# Patient Record
Sex: Female | Born: 1985 | Race: White | Hispanic: No | Marital: Married | Smoking: Never smoker
Health system: Southern US, Community
[De-identification: ages and names within clinical notes are randomized; demographics above are authoritative.]

## PROBLEM LIST (undated history)

## (undated) DIAGNOSIS — O139 Gestational [pregnancy-induced] hypertension without significant proteinuria, unspecified trimester: Secondary | ICD-10-CM

## (undated) DIAGNOSIS — I10 Essential (primary) hypertension: Secondary | ICD-10-CM

## (undated) DIAGNOSIS — F329 Major depressive disorder, single episode, unspecified: Secondary | ICD-10-CM

## (undated) DIAGNOSIS — F419 Anxiety disorder, unspecified: Secondary | ICD-10-CM

## (undated) DIAGNOSIS — F32A Depression, unspecified: Secondary | ICD-10-CM

## (undated) HISTORY — PX: WISDOM TOOTH EXTRACTION: SHX21

## (undated) HISTORY — PX: HERNIA REPAIR: SHX51

## (undated) HISTORY — PX: FOOT SURGERY: SHX648

## (undated) HISTORY — PX: NASAL SINUS SURGERY: SHX719

## (undated) HISTORY — DX: Gestational (pregnancy-induced) hypertension without significant proteinuria, unspecified trimester: O13.9

---
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
## 2004-08-22 ENCOUNTER — Other Ambulatory Visit: Admission: RE | Admit: 2004-08-22 | Discharge: 2004-08-22 | Payer: Self-pay | Admitting: *Deleted

## 2005-09-04 ENCOUNTER — Other Ambulatory Visit: Admission: RE | Admit: 2005-09-04 | Discharge: 2005-09-04 | Payer: Self-pay | Admitting: *Deleted

## 2006-09-05 ENCOUNTER — Other Ambulatory Visit: Admission: RE | Admit: 2006-09-05 | Discharge: 2006-09-05 | Payer: Self-pay | Admitting: *Deleted

## 2007-10-02 ENCOUNTER — Other Ambulatory Visit: Admission: RE | Admit: 2007-10-02 | Discharge: 2007-10-02 | Payer: Self-pay | Admitting: Family Medicine

## 2008-10-28 ENCOUNTER — Other Ambulatory Visit: Admission: RE | Admit: 2008-10-28 | Discharge: 2008-10-28 | Payer: Self-pay | Admitting: Family Medicine

## 2009-09-12 ENCOUNTER — Emergency Department (HOSPITAL_BASED_OUTPATIENT_CLINIC_OR_DEPARTMENT_OTHER): Admission: EM | Admit: 2009-09-12 | Discharge: 2009-09-12 | Payer: Self-pay | Admitting: Emergency Medicine

## 2009-09-12 ENCOUNTER — Ambulatory Visit: Payer: Self-pay | Admitting: Diagnostic Radiology

## 2009-12-27 ENCOUNTER — Encounter: Admission: RE | Admit: 2009-12-27 | Discharge: 2009-12-27 | Payer: Self-pay | Admitting: Family Medicine

## 2010-01-18 ENCOUNTER — Other Ambulatory Visit: Admission: RE | Admit: 2010-01-18 | Discharge: 2010-01-18 | Payer: Self-pay | Admitting: Family Medicine

## 2011-02-10 IMAGING — US US SOFT TISSUE HEAD/NECK
1 series · 14 of 25 positions shown · non-contrast
Comparison: None.

CLINICAL DATA: Thyromegaly

THYROID ULTRASOUND
TECHNIQUE: Ultrasound examination of the thyroid gland and
adjacent soft tissues was performed.

[Series 1: us soft tissue head/neck · 0.06mm/px · 14 of 25 slices shown]
[im 1/25]
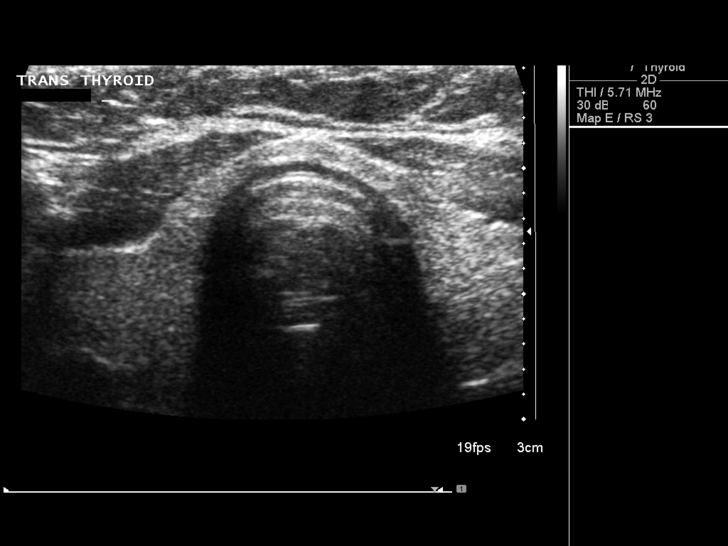
[im 3/25]
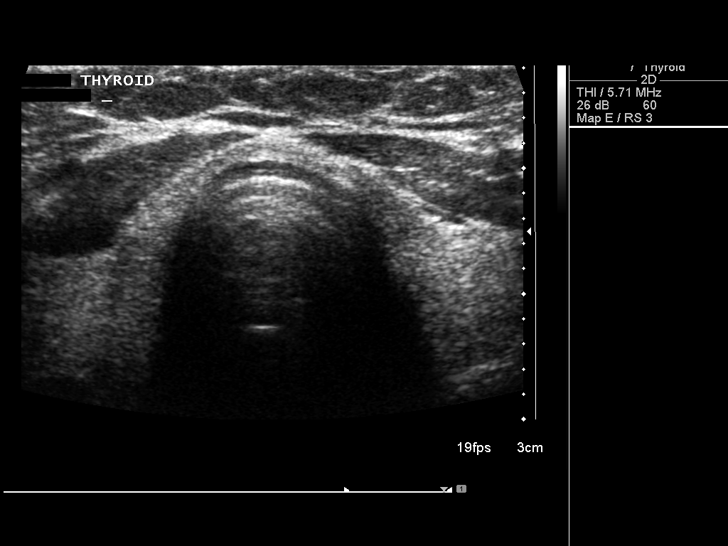
[im 5/25]
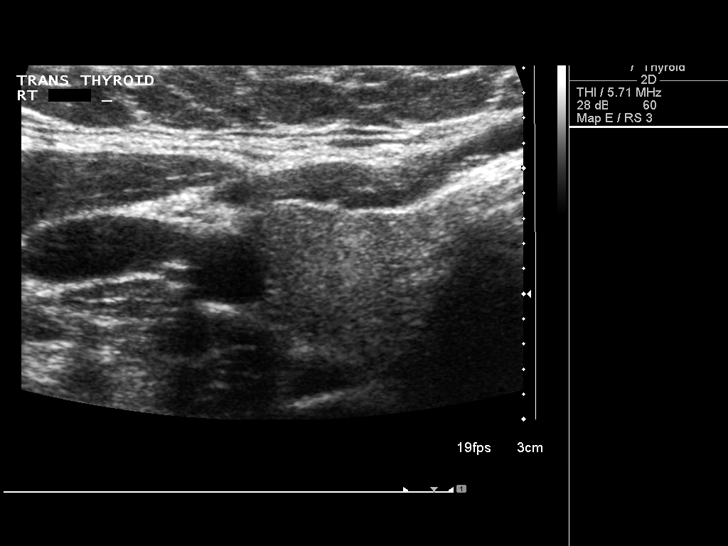
[im 7/25]
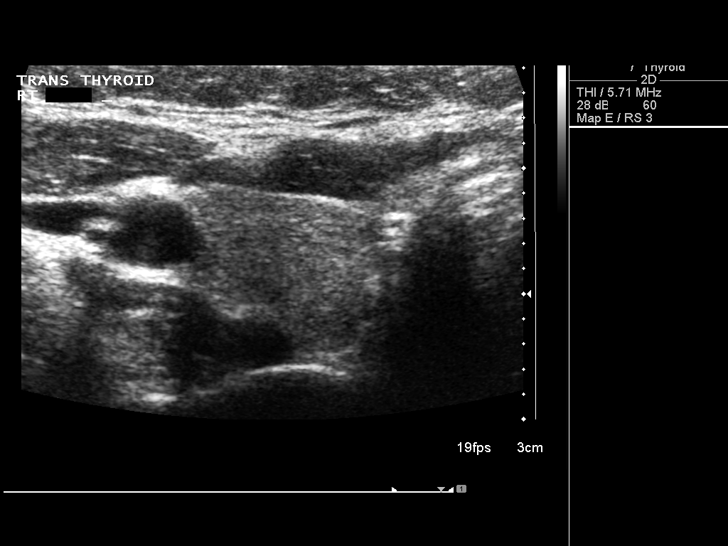
[im 9/25]
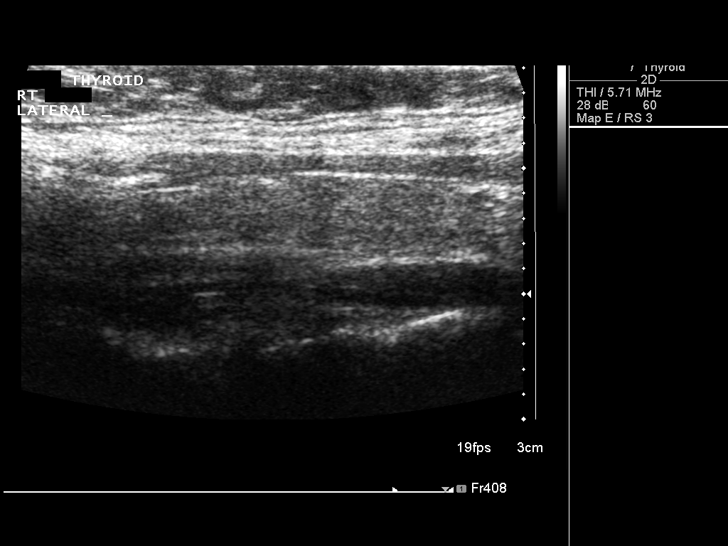
[im 10/25]
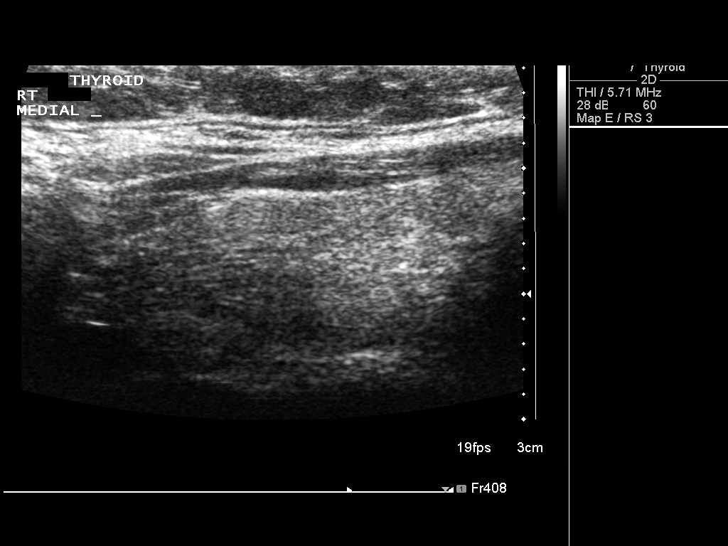
[im 12/25]
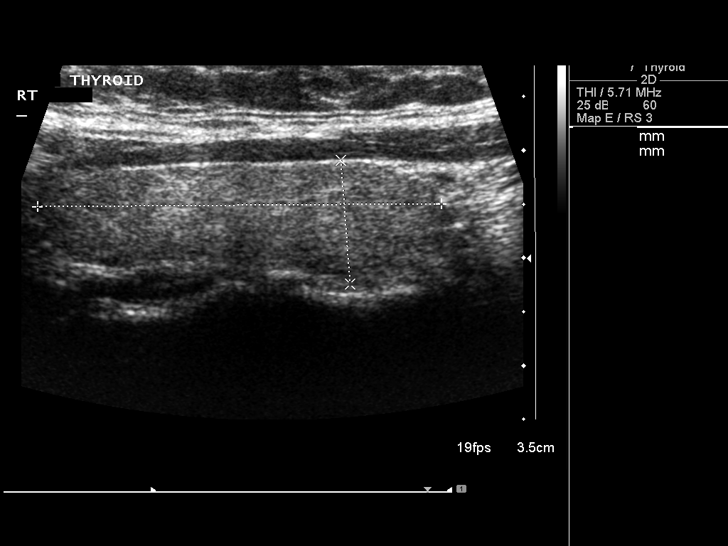
[im 14/25]
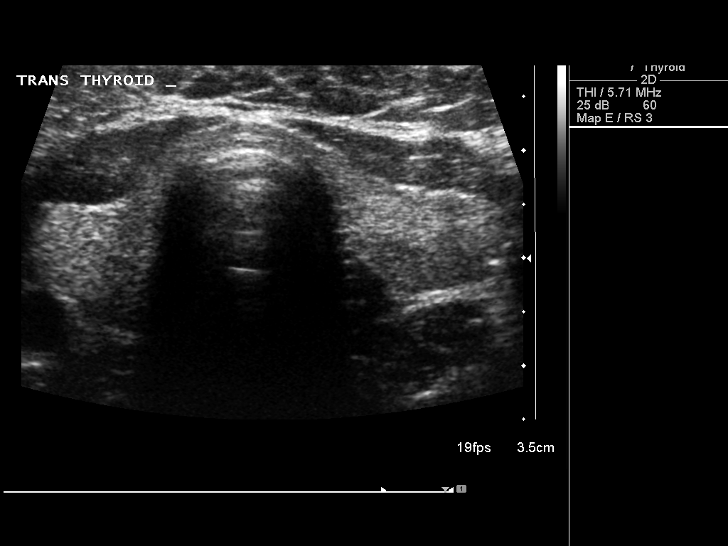
[im 16/25]
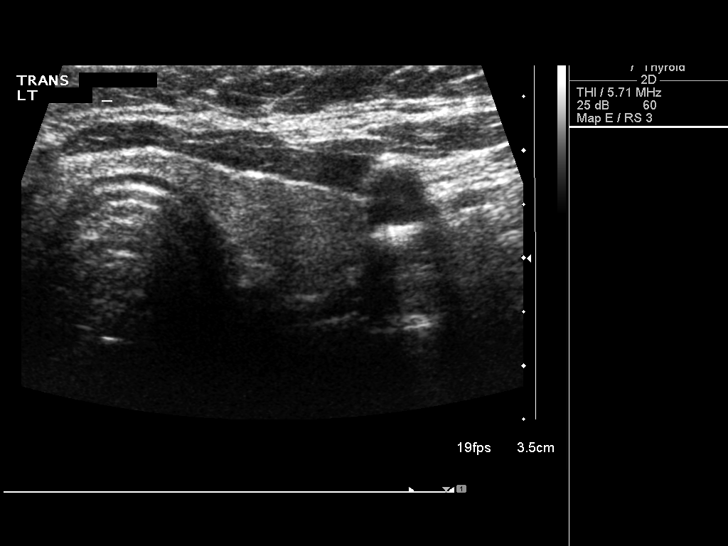
[im 17/25]
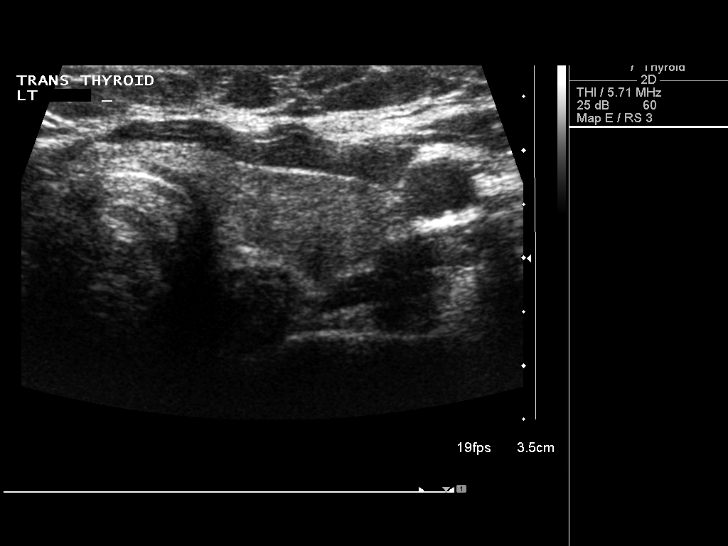
[im 19/25]
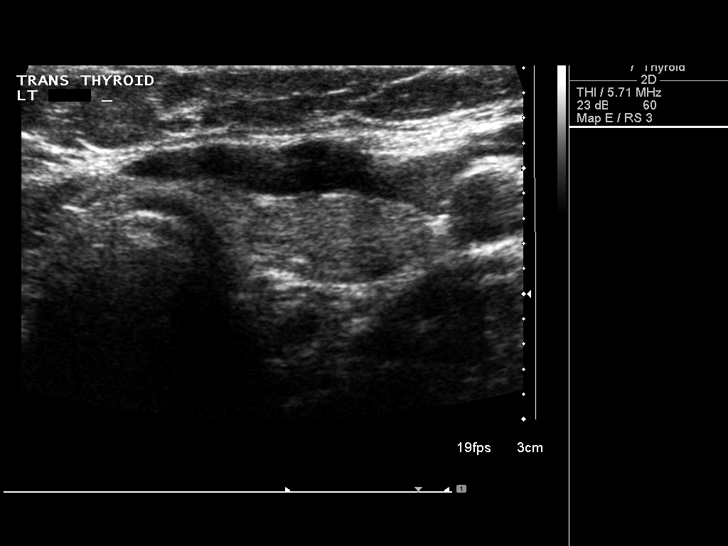
[im 21/25]
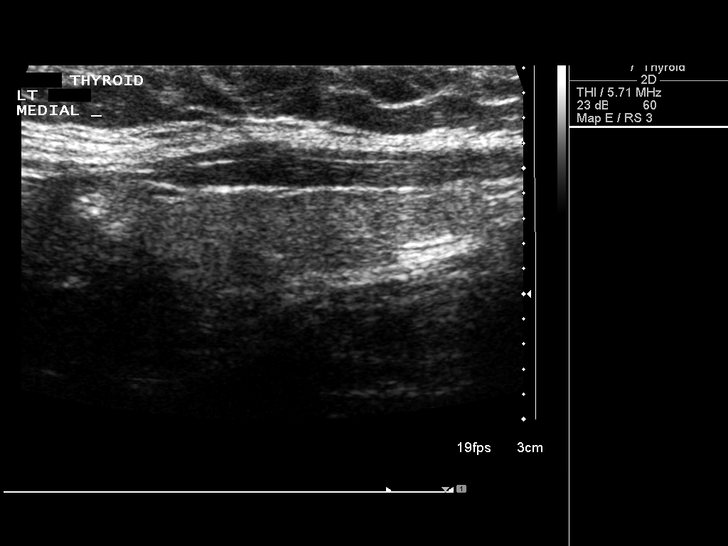
[im 23/25]
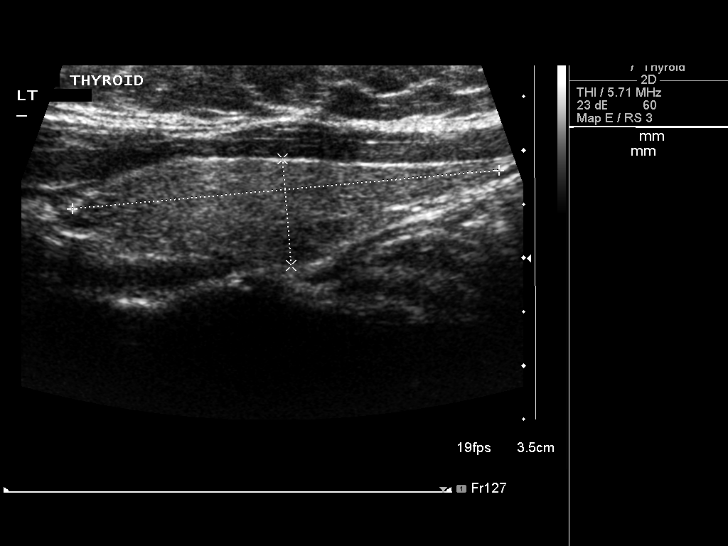
[im 25/25]
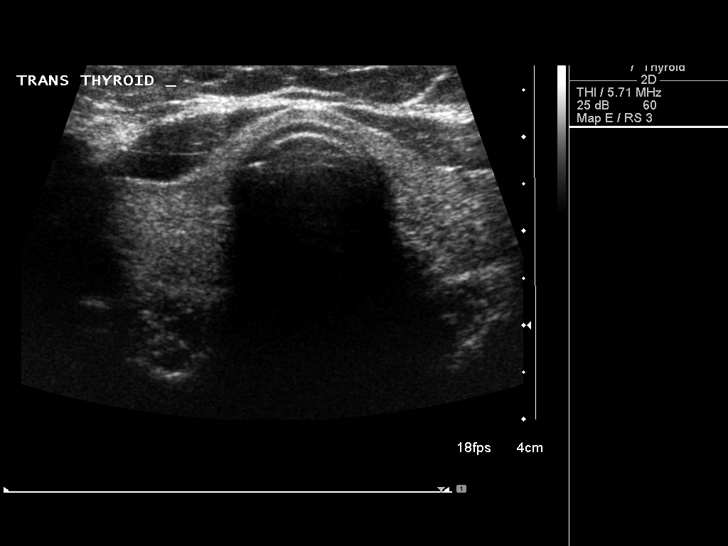

[14 of 25 positions shown; findings below may reference images not displayed]

FINDINGS: The isthmus is 1.7 mm in thickness, unremarkable.

Right lobe 12 x 19 x 38 mm, homogeneous in echotexture without
focal lesion.

Left lobe 10 x 17 x 40 mm, homogeneous in echotexture without focal
lesion.
IMPRESSION: Negative

## 2011-05-16 ENCOUNTER — Other Ambulatory Visit (HOSPITAL_COMMUNITY)
Admission: RE | Admit: 2011-05-16 | Discharge: 2011-05-16 | Disposition: A | Discharge status: Home / Self Care | Payer: BC Managed Care – PPO | Source: Ambulatory Visit | Attending: Family Medicine | Admitting: Family Medicine

## 2011-05-16 DIAGNOSIS — Z1159 Encounter for screening for other viral diseases: Secondary | ICD-10-CM | POA: Insufficient documentation

## 2011-05-16 DIAGNOSIS — Z124 Encounter for screening for malignant neoplasm of cervix: Secondary | ICD-10-CM | POA: Insufficient documentation

## 2013-01-29 LAB — OB RESULTS CONSOLE HEPATITIS B SURFACE ANTIGEN: Hepatitis B Surface Ag: NEGATIVE

## 2013-01-29 LAB — OB RESULTS CONSOLE ABO/RH

## 2013-01-29 LAB — OB RESULTS CONSOLE ANTIBODY SCREEN: Antibody Screen: NEGATIVE

## 2013-06-15 ENCOUNTER — Telehealth (HOSPITAL_COMMUNITY): Payer: Self-pay | Admitting: *Deleted

## 2013-06-15 ENCOUNTER — Encounter (HOSPITAL_COMMUNITY): Payer: Self-pay | Admitting: *Deleted

## 2013-06-15 NOTE — Telephone Encounter (Signed)
Preadmission screen  

## 2013-06-18 ENCOUNTER — Encounter (HOSPITAL_COMMUNITY): Payer: BC Managed Care – PPO | Admitting: Anesthesiology

## 2013-06-18 ENCOUNTER — Inpatient Hospital Stay (HOSPITAL_COMMUNITY)
Admission: RE | Admit: 2013-06-18 | Discharge: 2013-06-20 | DRG: 774 | Disposition: A | Discharge status: Home / Self Care | Payer: BC Managed Care – PPO | Source: Ambulatory Visit | Attending: Obstetrics and Gynecology | Admitting: Obstetrics and Gynecology

## 2013-06-18 ENCOUNTER — Encounter (HOSPITAL_COMMUNITY): Payer: Self-pay

## 2013-06-18 ENCOUNTER — Inpatient Hospital Stay (HOSPITAL_COMMUNITY): Payer: BC Managed Care – PPO | Admitting: Anesthesiology

## 2013-06-18 DIAGNOSIS — O1002 Pre-existing essential hypertension complicating childbirth: Principal | ICD-10-CM | POA: Diagnosis present

## 2013-06-18 HISTORY — DX: Essential (primary) hypertension: I10

## 2013-06-18 HISTORY — DX: Major depressive disorder, single episode, unspecified: F32.9

## 2013-06-18 HISTORY — DX: Depression, unspecified: F32.A

## 2013-06-18 HISTORY — DX: Anxiety disorder, unspecified: F41.9

## 2013-06-18 LAB — LACTATE DEHYDROGENASE: LDH: 164 U/L (ref 94–250)

## 2013-06-18 LAB — CBC
HCT: 36.9 % (ref 36.0–46.0)
Hemoglobin: 12.2 g/dL (ref 12.0–15.0)
MCHC: 33.1 g/dL (ref 30.0–36.0)
MCV: 83.1 fL (ref 78.0–100.0)
WBC: 14.9 10*3/uL — ABNORMAL HIGH (ref 4.0–10.5)

## 2013-06-18 LAB — TYPE AND SCREEN
ABO/RH(D): A POS
Antibody Screen: NEGATIVE

## 2013-06-18 LAB — COMPREHENSIVE METABOLIC PANEL
ALT: 11 U/L (ref 0–35)
AST: 16 U/L (ref 0–37)
Albumin: 3 g/dL — ABNORMAL LOW (ref 3.5–5.2)
Alkaline Phosphatase: 170 U/L — ABNORMAL HIGH (ref 39–117)
BUN: 11 mg/dL (ref 6–23)
CO2: 22 mEq/L (ref 19–32)
Chloride: 101 mEq/L (ref 96–112)
Creatinine, Ser: 0.78 mg/dL (ref 0.50–1.10)
GFR calc non Af Amer: >90 mL/min (ref >90)
Potassium: 3.6 mEq/L (ref 3.5–5.1)
Sodium: 135 mEq/L (ref 135–145)
Total Bilirubin: 0.3 mg/dL (ref 0.3–1.2)
Total Protein: 6.7 g/dL (ref 6.0–8.3)

## 2013-06-18 LAB — ABO/RH: ABO/RH(D): A POS

## 2013-06-18 LAB — URIC ACID: Uric Acid, Serum: 5 mg/dL (ref 2.4–7.0)

## 2013-06-18 MED ORDER — FLEET ENEMA 7-19 GM/118ML RE ENEM: 1.0000 | ENEMA | RECTAL | Status: DC | PRN

## 2013-06-18 MED ORDER — EPHEDRINE 5 MG/ML INJ
10.0000 mg | INTRAVENOUS | Status: DC | PRN
  Filled 2013-06-18: qty 2
  Filled 2013-06-18: qty 4

## 2013-06-18 MED ORDER — DIPHENHYDRAMINE HCL 50 MG/ML IJ SOLN: 12.5000 mg | INTRAMUSCULAR | Status: DC | PRN

## 2013-06-18 MED ORDER — IBUPROFEN 600 MG PO TABS
600.0000 mg | ORAL_TABLET | Freq: Four times a day (QID) | ORAL | Status: DC
  Administered 2013-06-19 – 2013-06-20 (×6): 600 mg via ORAL
  Filled 2013-06-18 (×6): qty 1

## 2013-06-18 MED ORDER — LABETALOL HCL 100 MG PO TABS
100.0000 mg | ORAL_TABLET | Freq: Once | ORAL | Status: AC
  Administered 2013-06-18: 100 mg via ORAL
  Filled 2013-06-18: qty 1

## 2013-06-18 MED ORDER — LABETALOL HCL 100 MG PO TABS
100.0000 mg | ORAL_TABLET | Freq: Two times a day (BID) | ORAL | Status: DC
  Administered 2013-06-19 – 2013-06-20 (×3): 100 mg via ORAL
  Filled 2013-06-18 (×3): qty 1

## 2013-06-18 MED ORDER — LANOLIN HYDROUS EX OINT: TOPICAL_OINTMENT | CUTANEOUS | Status: DC | PRN

## 2013-06-18 MED ORDER — DIBUCAINE 1 % RE OINT: 1.0000 "application " | TOPICAL_OINTMENT | RECTAL | Status: DC | PRN

## 2013-06-18 MED ORDER — ONDANSETRON HCL 4 MG/2ML IJ SOLN: 4.0000 mg | INTRAMUSCULAR | Status: DC | PRN

## 2013-06-18 MED ORDER — IBUPROFEN 600 MG PO TABS
600.0000 mg | ORAL_TABLET | Freq: Four times a day (QID) | ORAL | Status: DC | PRN
  Administered 2013-06-18: 600 mg via ORAL
  Filled 2013-06-18: qty 1

## 2013-06-18 MED ORDER — PHENYLEPHRINE 40 MCG/ML (10ML) SYRINGE FOR IV PUSH (FOR BLOOD PRESSURE SUPPORT)
80.0000 ug | PREFILLED_SYRINGE | INTRAVENOUS | Status: DC | PRN
  Filled 2013-06-18: qty 5
  Filled 2013-06-18: qty 2

## 2013-06-18 MED ORDER — SENNOSIDES-DOCUSATE SODIUM 8.6-50 MG PO TABS
2.0000 | ORAL_TABLET | ORAL | Status: DC
  Administered 2013-06-19 – 2013-06-20 (×2): 2 via ORAL
  Filled 2013-06-18 (×3): qty 2

## 2013-06-18 MED ORDER — OXYTOCIN 40 UNITS IN LACTATED RINGERS INFUSION - SIMPLE MED
1.0000 m[IU]/min | INTRAVENOUS | Status: DC
  Administered 2013-06-18: 3 m[IU]/min via INTRAVENOUS
  Administered 2013-06-18: 1 m[IU]/min via INTRAVENOUS
  Filled 2013-06-18: qty 1000

## 2013-06-18 MED ORDER — SODIUM BICARBONATE 8.4 % IV SOLN
INTRAVENOUS | Status: DC | PRN
  Administered 2013-06-18: 5 mL via EPIDURAL

## 2013-06-18 MED ORDER — DIPHENHYDRAMINE HCL 25 MG PO CAPS: 25.0000 mg | ORAL_CAPSULE | Freq: Four times a day (QID) | ORAL | Status: DC | PRN

## 2013-06-18 MED ORDER — ONDANSETRON HCL 4 MG/2ML IJ SOLN: 4.0000 mg | Freq: Four times a day (QID) | INTRAMUSCULAR | Status: DC | PRN

## 2013-06-18 MED ORDER — LACTATED RINGERS IV SOLN
INTRAVENOUS | Status: DC
  Administered 2013-06-18: 08:00:00 via INTRAVENOUS

## 2013-06-18 MED ORDER — SIMETHICONE 80 MG PO CHEW: 80.0000 mg | CHEWABLE_TABLET | ORAL | Status: DC | PRN

## 2013-06-18 MED ORDER — WITCH HAZEL-GLYCERIN EX PADS: 1.0000 "application " | MEDICATED_PAD | CUTANEOUS | Status: DC | PRN

## 2013-06-18 MED ORDER — ONDANSETRON HCL 4 MG PO TABS: 4.0000 mg | ORAL_TABLET | ORAL | Status: DC | PRN

## 2013-06-18 MED ORDER — MEASLES, MUMPS & RUBELLA VAC ~~LOC~~ INJ
0.5000 mL | INJECTION | Freq: Once | SUBCUTANEOUS | Status: DC
  Filled 2013-06-18: qty 0.5

## 2013-06-18 MED ORDER — BENZOCAINE-MENTHOL 20-0.5 % EX AERO
1.0000 "application " | INHALATION_SPRAY | CUTANEOUS | Status: DC | PRN
  Administered 2013-06-18 – 2013-06-19 (×2): 1 via TOPICAL
  Filled 2013-06-18 (×2): qty 56

## 2013-06-18 MED ORDER — LACTATED RINGERS IV SOLN: 500.0000 mL | INTRAVENOUS | Status: DC | PRN

## 2013-06-18 MED ORDER — PRENATAL MULTIVITAMIN CH: 1.0000 | ORAL_TABLET | Freq: Every day | ORAL | Status: DC

## 2013-06-18 MED ORDER — LIDOCAINE HCL (PF) 1 % IJ SOLN
30.0000 mL | INTRAMUSCULAR | Status: DC | PRN
  Filled 2013-06-18 (×2): qty 30

## 2013-06-18 MED ORDER — ZOLPIDEM TARTRATE 5 MG PO TABS: 5.0000 mg | ORAL_TABLET | Freq: Every evening | ORAL | Status: DC | PRN

## 2013-06-18 MED ORDER — ACETAMINOPHEN 325 MG PO TABS: 650.0000 mg | ORAL_TABLET | ORAL | Status: DC | PRN

## 2013-06-18 MED ORDER — OXYCODONE-ACETAMINOPHEN 5-325 MG PO TABS
1.0000 | ORAL_TABLET | ORAL | Status: DC | PRN
  Administered 2013-06-18 – 2013-06-19 (×3): 1 via ORAL
  Filled 2013-06-18 (×4): qty 1

## 2013-06-18 MED ORDER — CITRIC ACID-SODIUM CITRATE 334-500 MG/5ML PO SOLN: 30.0000 mL | ORAL | Status: DC | PRN

## 2013-06-18 MED ORDER — PRENATAL MULTIVITAMIN CH
1.0000 | ORAL_TABLET | Freq: Every day | ORAL | Status: DC
  Administered 2013-06-19: 1 via ORAL
  Filled 2013-06-18: qty 1

## 2013-06-18 MED ORDER — OXYTOCIN 40 UNITS IN LACTATED RINGERS INFUSION - SIMPLE MED: 62.5000 mL/h | INTRAVENOUS | Status: AC | PRN

## 2013-06-18 MED ORDER — TETANUS-DIPHTH-ACELL PERTUSSIS 5-2.5-18.5 LF-MCG/0.5 IM SUSP: 0.5000 mL | Freq: Once | INTRAMUSCULAR | Status: DC

## 2013-06-18 MED ORDER — OXYTOCIN 40 UNITS IN LACTATED RINGERS INFUSION - SIMPLE MED: 62.5000 mL/h | INTRAVENOUS | Status: DC

## 2013-06-18 MED ORDER — FENTANYL 2.5 MCG/ML BUPIVACAINE 1/10 % EPIDURAL INFUSION (WH - ANES)
14.0000 mL/h | INTRAMUSCULAR | Status: DC | PRN
  Administered 2013-06-18: 14 mL/h via EPIDURAL
  Filled 2013-06-18: qty 125

## 2013-06-18 MED ORDER — LACTATED RINGERS IV SOLN: INTRAVENOUS | Status: AC

## 2013-06-18 MED ORDER — PHENYLEPHRINE 40 MCG/ML (10ML) SYRINGE FOR IV PUSH (FOR BLOOD PRESSURE SUPPORT)
80.0000 ug | PREFILLED_SYRINGE | INTRAVENOUS | Status: DC | PRN
  Filled 2013-06-18: qty 2

## 2013-06-18 MED ORDER — TERBUTALINE SULFATE 1 MG/ML IJ SOLN: 0.2500 mg | Freq: Once | INTRAMUSCULAR | Status: DC | PRN

## 2013-06-18 MED ORDER — LACTATED RINGERS IV SOLN: 500.0000 mL | Freq: Once | INTRAVENOUS | Status: DC

## 2013-06-18 MED ORDER — EPHEDRINE 5 MG/ML INJ
10.0000 mg | INTRAVENOUS | Status: DC | PRN
  Filled 2013-06-18: qty 2

## 2013-06-18 MED ORDER — OXYTOCIN BOLUS FROM INFUSION
500.0000 mL | INTRAVENOUS | Status: DC
Start: 2013-06-18 — End: 2013-06-20
  Administered 2013-06-18: 500 mL via INTRAVENOUS

## 2013-06-18 MED ORDER — OXYCODONE-ACETAMINOPHEN 5-325 MG PO TABS: 1.0000 | ORAL_TABLET | ORAL | Status: DC | PRN

## 2013-06-18 NOTE — Progress Notes (Signed)
Patient ID: [name], female   DOB: 1985/11/30, 27 y.o.   MRN: [id number] The pt received an epidural and progressed rapidly to full dilatation. She pushed well and delivered a living female infant ROA over a second degree left of ML laceration. Apgars were 9 and 9 at 1 and 5 minutes. The placenta appeared intact but I removed a fragment from the fundus after the placenta delivered. The laceration was repaired with 3-0 vicryl. EBL 400 cc's.

## 2013-06-18 NOTE — Anesthesia Procedure Notes (Signed)

## 2013-06-18 NOTE — Progress Notes (Signed)
Patient ID: [name], female   DOB: 12-06-1985, 27 y.o.   MRN: [id number] BP rose after delivery so labetalol 100 mg po given BP now 135/88

## 2013-06-18 NOTE — Anesthesia Preprocedure Evaluation (Signed)

## 2013-06-18 NOTE — H&P (Signed)
[name], [name]              ACCOUNT NO.:  [id number]  MEDICAL RECORD NO.:  [id number]  LOCATION:  9170                          FACILITY:  [facility]  PHYSICIAN:  Malachi Pro. Ambrose Mantle, M.D. DATE OF BIRTH:  02/09/1986  DATE OF ADMISSION:  06/18/2013 DATE OF DISCHARGE:                             HISTORY & PHYSICAL   PRESENT ILLNESS:  This is a 27 year old white female, para 0, gravida 1, EDC June 23, 2013, by an ultrasound at 8 weeks and 2 days, admitted for induction of labor with hypertension.  Blood group and type A positive with a negative antibody, rubella immune, RPR nonreactive. Urine culture negative.  Hepatitis B surface antigen negative.  HIV negative.  GC and Chlamydia negative.  First trimester screen normal. AFP negative.  One hour Glucola 84.  Group B strep negative.  The patient was seen initially with a history of high blood pressure.  Her blood pressure 102/70.  She was on metoprolol.  She was advised to stop the metoprolol and continued to have normal blood pressures.  Blood pressures remained normal until June 11, 2013, when the blood pressure was 138/98.  A 24 hour urine was normal.  PIH labs were normal in the blood.  Blood pressure remained elevated at 139/97 on June 15, 2013 and 140/100 on June 17, 2013.  PAST MEDICAL HISTORY:  A history of high blood pressure, anxiety disorder and depression.  She has never smoked.  She drank alcohol occasionally prior to pregnancy.  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  ALLERGIES:  No known drug allergies.  No latex allergy.  FAMILY HISTORY:  Her mother has anxiety and hypertension.  Father has hypertension.  Maternal grandmother with heart disease, cancer of the breast and hypertension.  Paternal grandfather with anxiety.  Paternal grandmother with cancer of the breast.  Paternal grandfather with heart attack and high blood pressure.  PAST SURGICAL HISTORY:  The patient has had  wisdom teeth extracted, sinus surgery, and an inguinal hernia repaired.  PHYSICAL EXAMINATION:  VITAL SIGNS:  On admission, her blood pressure was 129/113 on her initial blood pressure check, temperature is 98.7, pulse is 128, respirations 20. HEART:  Normal size and sounds.  No murmurs. LUNGS:  Clear to auscultation. PELVIC:  Fundal height was 37 cm on June 17, 2013.  Cervix 2 cm, 30%, vertex at a -3.  ADMITTING IMPRESSION:  Intrauterine pregnancy 39+ weeks, history of pre- existing hypertension that was normal during pregnancy until the last week.  The patient is admitted for Pitocin induction of labor.  Blood pressure will be watched carefully.  I am doing a PIH panel on admission, this was done within the last week and was normal.     Malachi Pro. Ambrose Mantle, M.D.     TFH/MEDQ  D:  06/18/2013  T:  06/18/2013  Job:  [id number]

## 2013-06-18 NOTE — Progress Notes (Signed)
Patient ID: [name], female   DOB: 03/06/1986, 27 y.o.   MRN: [id number] Per the RN [facility] labs are normal. Contractions are q 3 minutes but not very painful. The cervix is 2 cm 50% effaced and the vertex is at - 3 station AROM produced clear fluid.

## 2013-06-19 LAB — CBC
Hemoglobin: 10.2 g/dL — ABNORMAL LOW (ref 12.0–15.0)
MCH: 28.2 pg (ref 26.0–34.0)
MCV: 82.9 fL (ref 78.0–100.0)
Platelets: 218 10*3/uL (ref 150–400)
RBC: 3.62 MIL/uL — ABNORMAL LOW (ref 3.87–5.11)
RDW: 14.6 % (ref 11.5–15.5)

## 2013-06-19 NOTE — Progress Notes (Signed)
Patient ID: [name], female   DOB: [birth year], 27 y.o.   MRN: [id number] #1 afebrile BP 111/70 Pt feels fine Will continue labetalol unless pt has sx of hypotension.

## 2013-06-19 NOTE — Anesthesia Postprocedure Evaluation (Signed)
Anesthesia Post Note  Patient: [name]  Procedure(s) Performed: * No procedures listed *  Anesthesia type: Epidural  Patient location: Mother/Baby  Post pain: Pain level controlled  Post assessment: Post-op Vital signs reviewed  Last Vitals:  Filed Vitals:   06/19/13 0537  BP: 111/70  Pulse: 86  Temp: 36.9 C  Resp: 18    Post vital signs: Reviewed  Level of consciousness:alert  Complications: No apparent anesthesia complications

## 2013-06-20 MED ORDER — LABETALOL HCL 100 MG PO TABS: 100.0000 mg | ORAL_TABLET | Freq: Two times a day (BID) | ORAL | Status: DC

## 2013-06-20 MED ORDER — IBUPROFEN 600 MG PO TABS: 600.0000 mg | ORAL_TABLET | Freq: Four times a day (QID) | ORAL | Status: DC

## 2013-06-20 NOTE — Progress Notes (Signed)
PPD#2 Doing well Afeb, VSS, BP ok on Labetalol D/c home

## 2013-06-20 NOTE — Lactation Note (Addendum)
This note was copied from the chart of [name]. Lactation Consultation Note   Follow up consult with this mom and baby, now 40  Hours post partum. I observed mom latch and she and baby ddi greasst. i did teaching from the baby and me book, and mom knows to call for quuestiona/concerns.           Patient Name: [name] [id number] Date: 06/20/2013 Reason for consult: Follow-up assessment   Maternal Data    Feeding Feeding Type: Breast Fed Length of feed: 10 min  LATCH Score/Interventions Latch: Grasps breast easily, tongue down, lips flanged, rhythmical sucking. Intervention(s): Assist with latch;Adjust position  Audible Swallowing: Spontaneous and intermittent Intervention(s): Skin to skin  Type of Nipple: Everted at rest and after stimulation  Comfort (Breast/Nipple): Soft / non-tender     Hold (Positioning): No assistance needed to correctly position infant at breast. Intervention(s): Breastfeeding basics reviewed;Support Pillows;Position options;Skin to skin  LATCH Score: 10  Lactation Tools Discussed/Used     Consult Status Consult Status: Complete Follow-up type: Call as needed    Alfred Levins 06/20/2013, 9:53 AM

## 2013-06-20 NOTE — Discharge Instructions (Signed)
As per discharge pamphlet °

## 2013-06-20 NOTE — Discharge Summary (Signed)
Obstetric Discharge Summary Reason for Admission: induction of labor Prenatal Procedures: NST Intrapartum Procedures: spontaneous vaginal delivery Postpartum Procedures: none Complications-Operative and Postpartum: 2nd degree perineal laceration Hemoglobin  Date Value Range Status  06/19/2013 10.2* 12.0 - 15.0 g/dL Final     HCT  Date Value Range Status  06/19/2013 30.0* 36.0 - 46.0 % Final    Physical Exam:  General: alert Lochia: appropriate Uterine Fundus: firm  Discharge Diagnoses: Term Pregnancy-delivered and hypertension  Discharge Information: Date: 06/20/2013 Activity: pelvic rest Diet: routine Medications: Ibuprofen and labetalol Condition: stable Instructions: refer to practice specific booklet Discharge to: home Follow-up Information   Follow up with Bing Plume, MD. Schedule an appointment as soon as possible for a visit in 1 week. (for BP check)    Specialty:  Obstetrics and Gynecology   Contact information:   [address], [address], [address] [place] [address] [phone]       Newborn Data: Live born female  Birth Weight: 7 lb 10.4 oz (3470 g) APGAR: 8, 9  Home with mother.  [name] D 06/20/2013, 9:18 AM

## 2013-06-23 ENCOUNTER — Inpatient Hospital Stay (HOSPITAL_COMMUNITY): Admission: AD | Admit: 2013-06-23 | Payer: Self-pay | Source: Ambulatory Visit | Admitting: Obstetrics and Gynecology

## 2014-07-05 ENCOUNTER — Encounter (HOSPITAL_COMMUNITY): Payer: Self-pay

## 2018-07-21 ENCOUNTER — Other Ambulatory Visit: Payer: Self-pay

## 2018-07-21 ENCOUNTER — Emergency Department (HOSPITAL_BASED_OUTPATIENT_CLINIC_OR_DEPARTMENT_OTHER)
Admission: EM | Admit: 2018-07-21 | Discharge: 2018-07-22 | Disposition: A | Discharge status: Home / Self Care | Payer: BC Managed Care – PPO | Attending: Emergency Medicine | Admitting: Emergency Medicine

## 2018-07-21 ENCOUNTER — Encounter (HOSPITAL_BASED_OUTPATIENT_CLINIC_OR_DEPARTMENT_OTHER): Payer: Self-pay | Admitting: *Deleted

## 2018-07-21 ENCOUNTER — Emergency Department (HOSPITAL_BASED_OUTPATIENT_CLINIC_OR_DEPARTMENT_OTHER): Payer: BC Managed Care – PPO

## 2018-07-21 DIAGNOSIS — Y92000 Kitchen of unspecified non-institutional (private) residence as  the place of occurrence of the external cause: Secondary | ICD-10-CM | POA: Insufficient documentation

## 2018-07-21 DIAGNOSIS — Y9389 Activity, other specified: Secondary | ICD-10-CM | POA: Diagnosis not present

## 2018-07-21 DIAGNOSIS — W290XXA Contact with powered kitchen appliance, initial encounter: Secondary | ICD-10-CM | POA: Diagnosis not present

## 2018-07-21 DIAGNOSIS — I10 Essential (primary) hypertension: Secondary | ICD-10-CM | POA: Diagnosis not present

## 2018-07-21 DIAGNOSIS — Z79899 Other long term (current) drug therapy: Secondary | ICD-10-CM | POA: Diagnosis not present

## 2018-07-21 DIAGNOSIS — S61211A Laceration without foreign body of left index finger without damage to nail, initial encounter: Secondary | ICD-10-CM | POA: Diagnosis not present

## 2018-07-21 DIAGNOSIS — Y998 Other external cause status: Secondary | ICD-10-CM | POA: Diagnosis not present

## 2018-07-21 MED ORDER — LIDOCAINE HCL (PF) 1 % IJ SOLN
5.0000 mL | Freq: Once | INTRAMUSCULAR | Status: AC
  Administered 2018-07-22: 5 mL via INTRADERMAL
  Filled 2018-07-21: qty 5

## 2018-07-21 MED ORDER — LIDOCAINE HCL (PF) 1 % IJ SOLN
INTRAMUSCULAR | Status: AC
  Filled 2018-07-21: qty 5

## 2018-07-21 NOTE — ED Notes (Signed)
Pt's finger cleaned and wrapped in triage.

## 2018-07-21 NOTE — ED Notes (Signed)
Lac to left index finger w hand held blender  Bleeding controlled

## 2018-07-21 NOTE — ED Provider Notes (Signed)
[facility] EMERGENCY DEPARTMENT Provider Note   CSN: [id number] Arrival date & time: 07/21/18  2147     History   Chief Complaint Chief Complaint  Patient presents with  . Laceration    HPI [name] is a 32 y.o. female.  Patient sustained laceration to the distal tip of her left index finger while using a hand-held blender to make lotion and accidentally turning it on.  Sustained laceration across the palmar surface of the distal phalanx of the left index finger.  Bleeding is controlled.  No focal weakness, numbness or tingling.  Shot is up-to-date.  She is right-hand dominant  The history is provided by the patient.  Laceration      Past Medical History:  Diagnosis Date  . Anxiety   . Depression   . Hypertension     There are no active problems to display for this patient.   Past Surgical History:  Procedure Laterality Date  . FOOT SURGERY    . HERNIA REPAIR    . NASAL SINUS SURGERY    . WISDOM TOOTH EXTRACTION       OB History    Gravida  1   Para  1   Term  1   Preterm      AB      Living  1     SAB      TAB      Ectopic      Multiple      Live Births  1            Home Medications    Prior to Admission medications   Medication Sig Start Date End Date Taking? Authorizing Provider  METOPROLOL TARTRATE PO Take by mouth.   Yes [provider]  Venlafaxine HCl (EFFEXOR XR PO) Take by mouth.   Yes [provider]  ibuprofen (ADVIL,MOTRIN) 600 MG tablet Take 1 tablet (600 mg total) by mouth every 6 (six) hours. 06/20/13   Meisinger, Tawanna Cooler, MD  labetalol (NORMODYNE) 100 MG tablet Take 1 tablet (100 mg total) by mouth 2 (two) times daily. 06/20/13   Meisinger, Tawanna Cooler, MD  Prenatal Vit-Fe Fumarate-FA (PRENATAL MULTIVITAMIN) TABS tablet Take 1 tablet by mouth daily at 12 noon.    [provider]    Family History No family history on file.  Social History Social History   Tobacco Use  .  Smoking status: Never Smoker  . Smokeless tobacco: Never Used  Substance Use Topics  . Alcohol use: No  . Drug use: No     Allergies   Patient has no known allergies.   Review of Systems Review of Systems  Constitutional: Negative for activity change and appetite change.  HENT: Negative for congestion and rhinorrhea.   Respiratory: Negative for cough, chest tightness and shortness of breath.   Cardiovascular: Negative for chest pain.  Gastrointestinal: Negative for abdominal pain, nausea and vomiting.  Genitourinary: Negative for dysuria and hematuria.  Musculoskeletal: Negative for arthralgias.  Skin: Positive for wound.  Neurological: Negative for weakness and headaches.   all other systems are negative except as noted in the HPI and PMH.     Physical Exam Updated Vital Signs BP (!) 145/108 (BP Location: Right Arm)   Pulse (!) 108   Temp 98.5 F (36.9 C) (Oral)   Resp 20   Ht 5\' 4"  (1.626 m)   Wt 95.3 kg   LMP 07/18/2018   SpO2 99%   BMI 36.05 kg/m  Physical Exam  Constitutional: She is oriented to person, place, and time. She appears well-developed and well-nourished. No distress.  HENT:  Head: Normocephalic and atraumatic.  Mouth/Throat: Oropharynx is clear and moist. No oropharyngeal exudate.  Eyes: Pupils are equal, round, and reactive to light. Conjunctivae and EOM are normal.  Neck: Normal range of motion. Neck supple.  No meningismus.  Cardiovascular: Normal rate, regular rhythm, normal heart sounds and intact distal pulses.  No murmur heard. Pulmonary/Chest: Effort normal and breath sounds normal. No respiratory distress.  Abdominal: Soft. There is no tenderness. There is no rebound and no guarding.  Musculoskeletal: Normal range of motion. She exhibits tenderness. She exhibits no edema.  2 cm irregular lacerations x2 transversely over the palmar surface of the left index finger approaching but not violating nail fold.  PIP, DIP, MCP flexion  extension are intact. Distal sensation is intact.  Distal fingertip is well-perfused.  Neurological: She is alert and oriented to person, place, and time. No cranial nerve deficit. She exhibits normal muscle tone. Coordination normal.   5/5 strength throughout. CN 2-12 intact.Equal grip strength.   Skin: Skin is warm.  Psychiatric: She has a normal mood and affect. Her behavior is normal.  Nursing note and vitals reviewed.        ED Treatments / Results  Labs (all labs ordered are listed, but only abnormal results are displayed) Labs Reviewed - No data to display  EKG None  Radiology Dg Finger Index Left  Result Date: 07/21/2018 CLINICAL DATA:  Laceration left index finger tonight. EXAM: LEFT INDEX FINGER 2+V COMPARISON:  None. FINDINGS: A soft tissue laceration is seen along the radial aspect of the left index finger, adjacent to the tuft. No underlying osseous involvement nor radiopaque foreign body is seen. Joint spaces are maintained. IMPRESSION: Soft tissue laceration involving the tip of the left index finger without radiopaque foreign body nor acute osseous involvement. Electronically Signed   By: Tollie Eth M.D.   On: 07/21/2018 22:33    Procedures .Marland KitchenLaceration Repair Date/Time: 07/22/2018 12:39 AM Performed by: Glynn Octave, MD Authorized by: Glynn Octave, MD   Consent:    Consent obtained:  Verbal   Consent given by:  Patient   Risks discussed:  Infection, pain, poor cosmetic result, poor wound healing, need for additional repair, nerve damage, vascular damage, tendon damage and retained foreign body   Alternatives discussed:  No treatment Anesthesia (see MAR for exact dosages):    Anesthesia method:  Nerve block   Block location:  L index   Block needle gauge:  25 G   Block anesthetic:  Lidocaine 1% w/o epi   Block technique:  Digital   Block injection procedure:  Anatomic landmarks identified, anatomic landmarks palpated, negative aspiration for  blood, introduced needle and incremental injection   Block outcome:  Anesthesia achieved Laceration details:    Location:  Finger   Finger location:  L index finger   Length (cm):  2 Repair type:    Repair type:  Complex Pre-procedure details:    Preparation:  Patient was prepped and draped in usual sterile fashion and imaging obtained to evaluate for foreign bodies Exploration:    Limited defect created (wound extended): no     Hemostasis achieved with:  Direct pressure   Wound exploration: wound explored through full range of motion and entire depth of wound probed and visualized     Wound extent: muscle damage     Wound extent: no nerve damage noted, no tendon  damage noted, no underlying fracture noted and no vascular damage noted   Treatment:    Area cleansed with:  Betadine   Amount of cleaning:  Extensive   Irrigation solution:  Sterile water   Irrigation method:  Syringe   Visualized foreign bodies/material removed: no     Debridement:  Minimal   Undermining:  None   Scar revision: no   Skin repair:    Repair method:  Sutures   Suture size:  4-0   Wound skin closure material used: vicryl.   Suture technique:  Simple interrupted   Number of sutures:  7 Approximation:    Approximation:  Close Post-procedure details:    Dressing:  Adhesive bandage   Patient tolerance of procedure:  Tolerated well, no immediate complications   (including critical care time)  Medications Ordered in ED Medications  lidocaine (PF) (XYLOCAINE) 1 % injection 5 mL (has no administration in time range)  lidocaine (PF) (XYLOCAINE) 1 % injection (has no administration in time range)     Initial Impression / Assessment and Plan / ED Course  I have reviewed the triage vital signs and the nursing notes.  Pertinent labs & imaging results that were available during my care of the patient were reviewed by me and considered in my medical decision making (see chart for details).    Laceration to  left index finger from blender.  Neurovascularly intact.  Distal sensation intact.  X-rays negative for fracture or foreign body.  Tendon nerve function is intact.  Tetanus is up-to-date.  Wound repaired as above with absorbable sutures.  Prophylactic antibiotics given.  Patient to follow-up for wound check with her PCP this week.  Return precautions discussed. Final Clinical Impressions(s) / ED Diagnoses   Final diagnoses:  Laceration of left index finger without foreign body without damage to nail, initial encounter    ED Discharge Orders    None       Vinessa Macconnell, Jeannett SeniorStephen, MD 07/22/18 [phone]

## 2018-07-21 NOTE — ED Triage Notes (Signed)
laceration to her left index finger. She was making soap in a blender and stuck her finger in the blender.

## 2018-07-22 MED ORDER — CEPHALEXIN 500 MG PO CAPS: 500.0000 mg | ORAL_CAPSULE | Freq: Three times a day (TID) | ORAL | 0 refills | Status: DC

## 2018-07-22 MED ORDER — IBUPROFEN 600 MG PO TABS: 600.0000 mg | ORAL_TABLET | Freq: Four times a day (QID) | ORAL | 0 refills | Status: AC | PRN

## 2018-07-22 NOTE — Discharge Instructions (Signed)
Keep the wound clean and dry.  The sutures are absorbable and should disappear on their own.  Follow-up with your doctor for a wound check later this week.  Return to the ED with new or worsening symptoms.

## 2019-09-04 IMAGING — CR DG FINGER INDEX 2+V*L*
3 series · 3 of 3 positions shown · non-contrast
Comparison: None.

CLINICAL DATA: Laceration left index finger tonight.

EXAM:
LEFT INDEX FINGER 2+V

[x finger pa left]
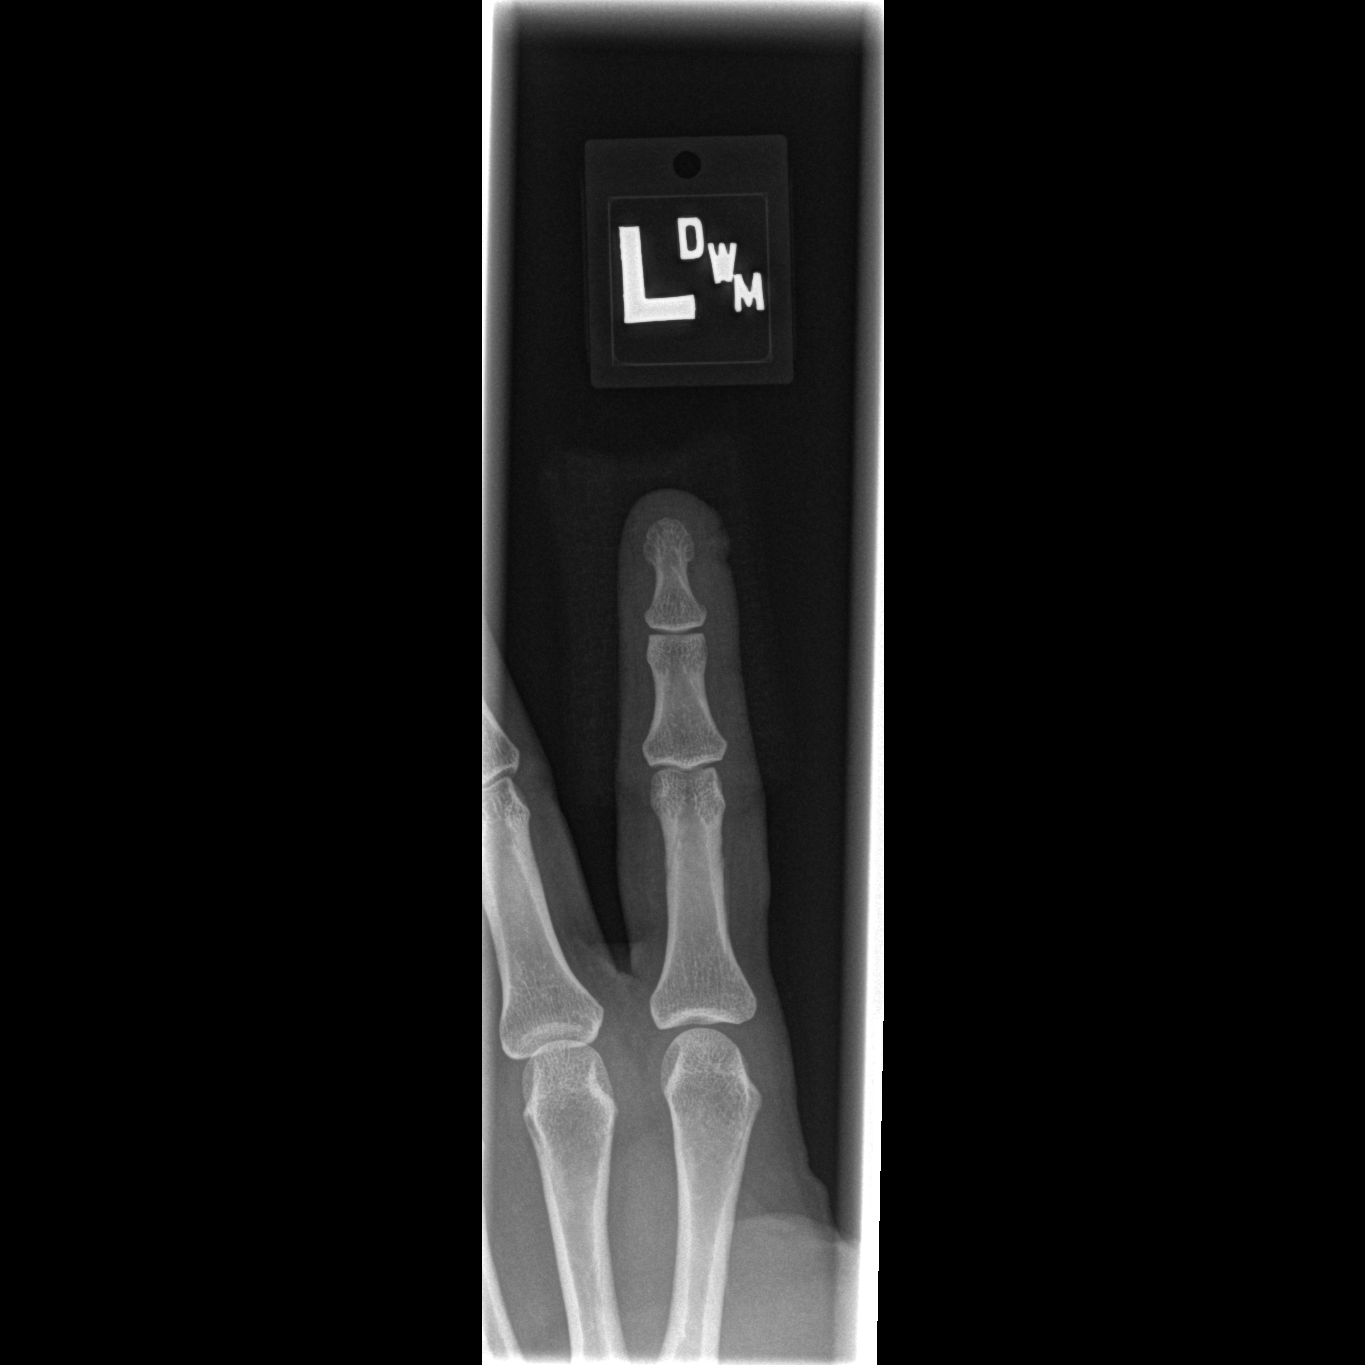

[x finger obl. left]
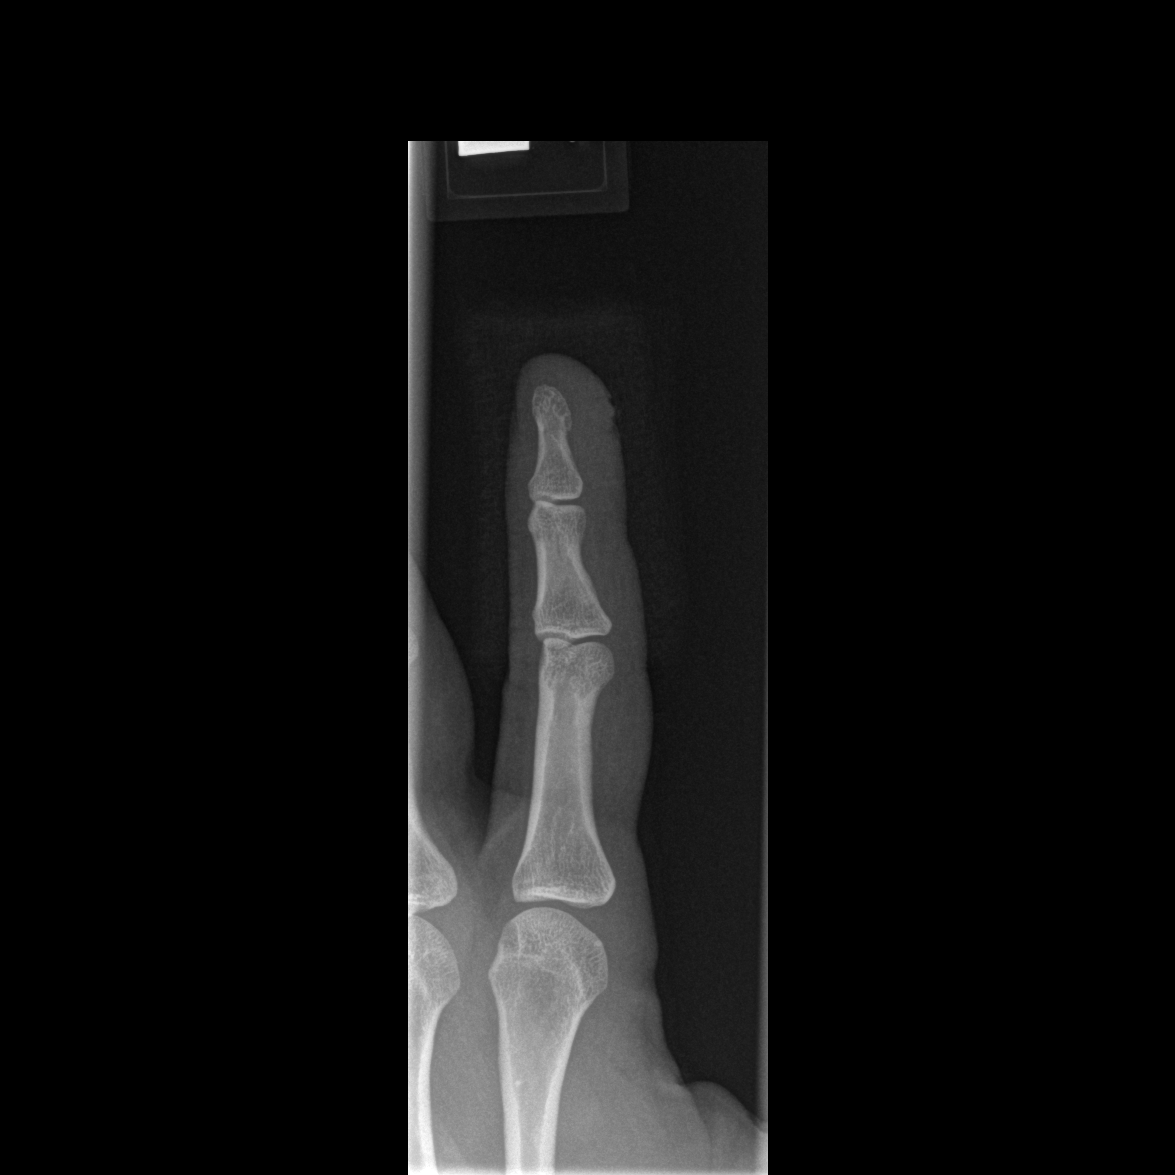

[x finger lateral left]
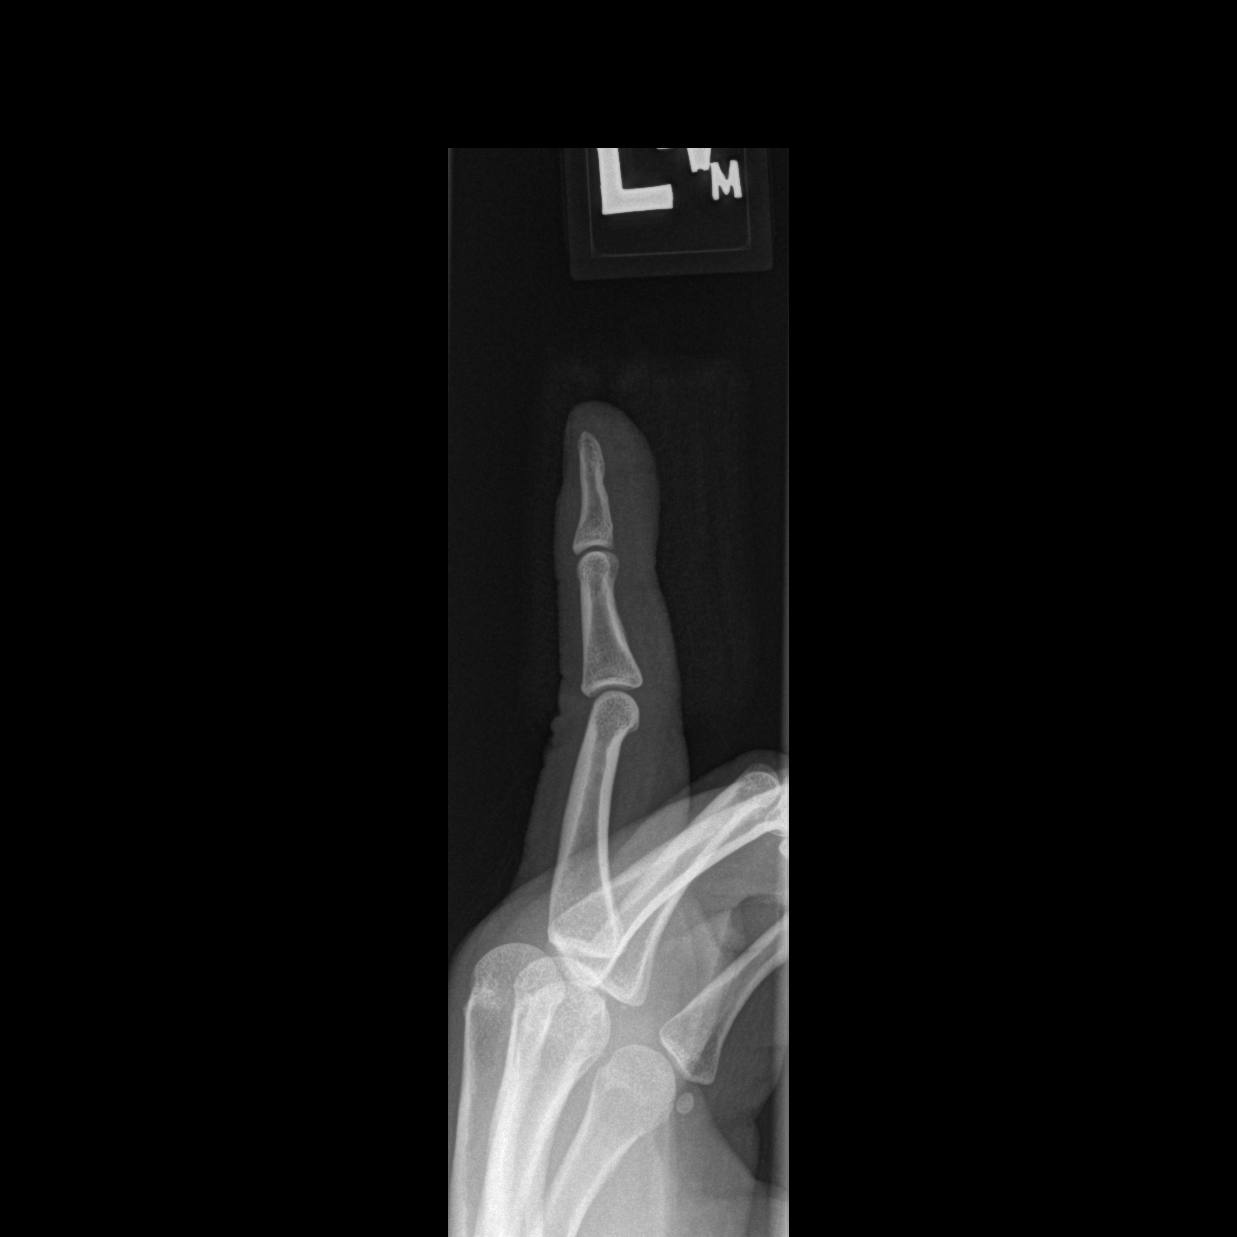

[3 of 3 positions shown; findings below may reference images not displayed]

FINDINGS: A soft tissue laceration is seen along the radial aspect of the left
index finger, adjacent to the tuft. No underlying osseous
involvement nor radiopaque foreign body is seen. Joint spaces are
maintained.
IMPRESSION: Soft tissue laceration involving the tip of the left index finger
without radiopaque foreign body nor acute osseous involvement.

## 2019-09-04 NOTE — L&D Delivery Note (Signed)
Delivery Note She progressed to complete and pushed well with emesis and regular pushing.  At 5:19 PM a viable female was delivered via Vaginal, Spontaneous (Presentation:   Occiput Anterior).  APGAR: 9, 9; weightpending .   Placenta status: Spontaneous, Intact.  Cord: 3 vessels with the following complications: Nuchal x 1 delivered through.  Anesthesia: Epidural Episiotomy: None Lacerations: 1st degree Suture Repair: 3.0 vicryl rapide Est. Blood Loss (mL): 200  Mom to postpartum.  Baby to Couplet care / Skin to Skin.  They would like baby circumcised, questions answered  Zenaida Niece 08/01/2020, 5:37 PM

## 2020-04-19 ENCOUNTER — Other Ambulatory Visit: Payer: Self-pay

## 2020-04-19 ENCOUNTER — Other Ambulatory Visit: Payer: BC Managed Care – PPO

## 2020-04-19 DIAGNOSIS — Z20822 Contact with and (suspected) exposure to covid-19: Secondary | ICD-10-CM

## 2020-04-20 LAB — SARS-COV-2, NAA 2 DAY TAT

## 2020-04-20 LAB — NOVEL CORONAVIRUS, NAA: SARS-CoV-2, NAA: NOT DETECTED

## 2020-07-22 LAB — OB RESULTS CONSOLE GBS: GBS: POSITIVE

## 2020-07-27 ENCOUNTER — Telehealth (HOSPITAL_COMMUNITY): Payer: Self-pay | Admitting: *Deleted

## 2020-07-27 ENCOUNTER — Encounter (HOSPITAL_COMMUNITY): Payer: Self-pay | Admitting: *Deleted

## 2020-07-27 NOTE — Telephone Encounter (Signed)
Preadmission screen  

## 2020-07-30 ENCOUNTER — Other Ambulatory Visit (HOSPITAL_COMMUNITY)
Admission: RE | Admit: 2020-07-30 | Discharge: 2020-07-30 | Disposition: A | Discharge status: Home / Self Care | Payer: BC Managed Care – PPO | Source: Ambulatory Visit | Attending: Obstetrics and Gynecology | Admitting: Obstetrics and Gynecology

## 2020-07-30 DIAGNOSIS — Z01812 Encounter for preprocedural laboratory examination: Secondary | ICD-10-CM | POA: Insufficient documentation

## 2020-07-30 DIAGNOSIS — Z20822 Contact with and (suspected) exposure to covid-19: Secondary | ICD-10-CM | POA: Insufficient documentation

## 2020-07-31 ENCOUNTER — Other Ambulatory Visit: Payer: Self-pay | Admitting: Obstetrics and Gynecology

## 2020-07-31 LAB — SARS CORONAVIRUS 2 (TAT 6-24 HRS): SARS Coronavirus 2: NEGATIVE

## 2020-08-01 ENCOUNTER — Inpatient Hospital Stay (HOSPITAL_COMMUNITY): Payer: BC Managed Care – PPO | Admitting: Anesthesiology

## 2020-08-01 ENCOUNTER — Inpatient Hospital Stay (HOSPITAL_COMMUNITY)
Admission: RE | Admit: 2020-08-01 | Discharge: 2020-08-03 | DRG: 807 | Disposition: A | Discharge status: Home / Self Care | Payer: BC Managed Care – PPO | Attending: Obstetrics and Gynecology | Admitting: Obstetrics and Gynecology

## 2020-08-01 ENCOUNTER — Encounter (HOSPITAL_COMMUNITY): Payer: Self-pay | Admitting: Obstetrics and Gynecology

## 2020-08-01 ENCOUNTER — Inpatient Hospital Stay (HOSPITAL_COMMUNITY): Payer: BC Managed Care – PPO

## 2020-08-01 ENCOUNTER — Other Ambulatory Visit: Payer: Self-pay

## 2020-08-01 DIAGNOSIS — O1002 Pre-existing essential hypertension complicating childbirth: Secondary | ICD-10-CM | POA: Diagnosis present

## 2020-08-01 DIAGNOSIS — O10913 Unspecified pre-existing hypertension complicating pregnancy, third trimester: Secondary | ICD-10-CM | POA: Diagnosis present

## 2020-08-01 DIAGNOSIS — Z3A38 38 weeks gestation of pregnancy: Secondary | ICD-10-CM

## 2020-08-01 DIAGNOSIS — F32A Depression, unspecified: Secondary | ICD-10-CM | POA: Diagnosis present

## 2020-08-01 DIAGNOSIS — O99214 Obesity complicating childbirth: Secondary | ICD-10-CM | POA: Diagnosis present

## 2020-08-01 DIAGNOSIS — F419 Anxiety disorder, unspecified: Secondary | ICD-10-CM | POA: Diagnosis present

## 2020-08-01 DIAGNOSIS — Z20822 Contact with and (suspected) exposure to covid-19: Secondary | ICD-10-CM | POA: Diagnosis present

## 2020-08-01 DIAGNOSIS — O99824 Streptococcus B carrier state complicating childbirth: Secondary | ICD-10-CM | POA: Diagnosis present

## 2020-08-01 DIAGNOSIS — O99344 Other mental disorders complicating childbirth: Secondary | ICD-10-CM | POA: Diagnosis present

## 2020-08-01 LAB — TYPE AND SCREEN
ABO/RH(D): A POS
Antibody Screen: NEGATIVE

## 2020-08-01 LAB — CBC
HCT: 34.8 % — ABNORMAL LOW (ref 36.0–46.0)
HCT: 38.4 % (ref 36.0–46.0)
Hemoglobin: 11.4 g/dL — ABNORMAL LOW (ref 12.0–15.0)
Hemoglobin: 12.3 g/dL (ref 12.0–15.0)
MCH: 27.6 pg (ref 26.0–34.0)
MCH: 27.9 pg (ref 26.0–34.0)
MCHC: 32 g/dL (ref 30.0–36.0)
MCHC: 32.8 g/dL (ref 30.0–36.0)
MCV: 85.3 fL (ref 80.0–100.0)
MCV: 86.1 fL (ref 80.0–100.0)
Platelets: 218 10*3/uL (ref 150–400)
Platelets: 297 10*3/uL (ref 150–400)
RBC: 4.08 MIL/uL (ref 3.87–5.11)
RBC: 4.46 MIL/uL (ref 3.87–5.11)
RDW: 14.2 % (ref 11.5–15.5)
RDW: 14.3 % (ref 11.5–15.5)
WBC: 13.3 10*3/uL — ABNORMAL HIGH (ref 4.0–10.5)
WBC: 13.7 10*3/uL — ABNORMAL HIGH (ref 4.0–10.5)
nRBC: 0 % (ref 0.0–0.2)
nRBC: 0 % (ref 0.0–0.2)

## 2020-08-01 LAB — RPR: RPR Ser Ql: NONREACTIVE

## 2020-08-01 MED ORDER — MEASLES, MUMPS & RUBELLA VAC IJ SOLR: 0.5000 mL | Freq: Once | INTRAMUSCULAR | Status: DC

## 2020-08-01 MED ORDER — EPHEDRINE 5 MG/ML INJ: 10.0000 mg | INTRAVENOUS | Status: DC | PRN

## 2020-08-01 MED ORDER — IBUPROFEN 600 MG PO TABS
600.0000 mg | ORAL_TABLET | Freq: Four times a day (QID) | ORAL | Status: DC
  Administered 2020-08-01 – 2020-08-03 (×5): 600 mg via ORAL
  Filled 2020-08-01 (×6): qty 1

## 2020-08-01 MED ORDER — EPHEDRINE 5 MG/ML INJ
10.0000 mg | INTRAVENOUS | Status: DC | PRN
  Filled 2020-08-01: qty 10

## 2020-08-01 MED ORDER — BUTORPHANOL TARTRATE 1 MG/ML IJ SOLN
1.0000 mg | INTRAMUSCULAR | Status: DC | PRN
  Administered 2020-08-01: 1 mg via INTRAVENOUS
  Filled 2020-08-01: qty 1

## 2020-08-01 MED ORDER — ONDANSETRON HCL 4 MG PO TABS: 4.0000 mg | ORAL_TABLET | ORAL | Status: DC | PRN

## 2020-08-01 MED ORDER — OXYCODONE-ACETAMINOPHEN 5-325 MG PO TABS: 2.0000 | ORAL_TABLET | ORAL | Status: DC | PRN

## 2020-08-01 MED ORDER — MAGNESIUM HYDROXIDE 400 MG/5ML PO SUSP: 30.0000 mL | ORAL | Status: DC | PRN

## 2020-08-01 MED ORDER — METHYLERGONOVINE MALEATE 0.2 MG/ML IJ SOLN: 0.2000 mg | INTRAMUSCULAR | Status: DC | PRN

## 2020-08-01 MED ORDER — LACTATED RINGERS IV SOLN
500.0000 mL | Freq: Once | INTRAVENOUS | Status: AC
  Administered 2020-08-01: 500 mL via INTRAVENOUS

## 2020-08-01 MED ORDER — ACETAMINOPHEN 325 MG PO TABS
650.0000 mg | ORAL_TABLET | ORAL | Status: DC | PRN
  Administered 2020-08-02 (×2): 650 mg via ORAL
  Filled 2020-08-01 (×2): qty 2

## 2020-08-01 MED ORDER — LACTATED RINGERS IV SOLN
500.0000 mL | INTRAVENOUS | Status: DC | PRN
  Administered 2020-08-01 (×2): 500 mL via INTRAVENOUS

## 2020-08-01 MED ORDER — ONDANSETRON HCL 4 MG/2ML IJ SOLN: 4.0000 mg | INTRAMUSCULAR | Status: DC | PRN

## 2020-08-01 MED ORDER — TERBUTALINE SULFATE 1 MG/ML IJ SOLN: 0.2500 mg | Freq: Once | INTRAMUSCULAR | Status: DC | PRN

## 2020-08-01 MED ORDER — FENTANYL-BUPIVACAINE-NACL 0.5-0.125-0.9 MG/250ML-% EP SOLN
12.0000 mL/h | EPIDURAL | Status: DC | PRN
  Filled 2020-08-01: qty 250

## 2020-08-01 MED ORDER — PENICILLIN G POT IN DEXTROSE 60000 UNIT/ML IV SOLN
3.0000 10*6.[IU] | INTRAVENOUS | Status: DC
  Administered 2020-08-01 (×4): 3 10*6.[IU] via INTRAVENOUS
  Filled 2020-08-01 (×4): qty 50

## 2020-08-01 MED ORDER — OXYTOCIN-SODIUM CHLORIDE 30-0.9 UT/500ML-% IV SOLN
1.0000 m[IU]/min | INTRAVENOUS | Status: DC
  Administered 2020-08-01: 2 m[IU]/min via INTRAVENOUS
  Filled 2020-08-01: qty 500

## 2020-08-01 MED ORDER — OXYCODONE HCL 5 MG PO TABS: 5.0000 mg | ORAL_TABLET | ORAL | Status: DC | PRN

## 2020-08-01 MED ORDER — SODIUM CHLORIDE 0.9 % IV SOLN
5.0000 10*6.[IU] | Freq: Once | INTRAVENOUS | Status: AC
  Administered 2020-08-01: 5 10*6.[IU] via INTRAVENOUS
  Filled 2020-08-01: qty 5

## 2020-08-01 MED ORDER — SERTRALINE HCL 50 MG PO TABS
75.0000 mg | ORAL_TABLET | Freq: Every day | ORAL | Status: DC
  Administered 2020-08-01 – 2020-08-03 (×3): 75 mg via ORAL
  Filled 2020-08-01 (×3): qty 1

## 2020-08-01 MED ORDER — COCONUT OIL OIL: 1.0000 "application " | TOPICAL_OIL | Status: DC | PRN

## 2020-08-01 MED ORDER — SENNOSIDES-DOCUSATE SODIUM 8.6-50 MG PO TABS
2.0000 | ORAL_TABLET | ORAL | Status: DC
  Administered 2020-08-02 – 2020-08-03 (×2): 2 via ORAL
  Filled 2020-08-01 (×2): qty 2

## 2020-08-01 MED ORDER — LABETALOL HCL 100 MG PO TABS: 100.0000 mg | ORAL_TABLET | Freq: Two times a day (BID) | ORAL | Status: DC

## 2020-08-01 MED ORDER — ZOLPIDEM TARTRATE 5 MG PO TABS: 5.0000 mg | ORAL_TABLET | Freq: Every evening | ORAL | Status: DC | PRN

## 2020-08-01 MED ORDER — PHENYLEPHRINE 40 MCG/ML (10ML) SYRINGE FOR IV PUSH (FOR BLOOD PRESSURE SUPPORT)
80.0000 ug | PREFILLED_SYRINGE | INTRAVENOUS | Status: DC | PRN
  Administered 2020-08-01 (×2): 80 ug via INTRAVENOUS

## 2020-08-01 MED ORDER — LIDOCAINE HCL (PF) 1 % IJ SOLN
INTRAMUSCULAR | Status: DC | PRN
  Administered 2020-08-01: 7 mL via EPIDURAL
  Administered 2020-08-01: 3 mL via EPIDURAL

## 2020-08-01 MED ORDER — OXYTOCIN BOLUS FROM INFUSION: 333.0000 mL | Freq: Once | INTRAVENOUS | Status: DC

## 2020-08-01 MED ORDER — LIDOCAINE HCL (PF) 1 % IJ SOLN: 30.0000 mL | INTRAMUSCULAR | Status: DC | PRN

## 2020-08-01 MED ORDER — PHENYLEPHRINE 40 MCG/ML (10ML) SYRINGE FOR IV PUSH (FOR BLOOD PRESSURE SUPPORT)
80.0000 ug | PREFILLED_SYRINGE | INTRAVENOUS | Status: DC | PRN
  Administered 2020-08-01: 80 ug via INTRAVENOUS
  Filled 2020-08-01: qty 10

## 2020-08-01 MED ORDER — DIPHENHYDRAMINE HCL 50 MG/ML IJ SOLN: 12.5000 mg | INTRAMUSCULAR | Status: DC | PRN

## 2020-08-01 MED ORDER — SODIUM CHLORIDE (PF) 0.9 % IJ SOLN
INTRAMUSCULAR | Status: DC | PRN
  Administered 2020-08-01: 12 mL/h via EPIDURAL

## 2020-08-01 MED ORDER — WITCH HAZEL-GLYCERIN EX PADS: 1.0000 "application " | MEDICATED_PAD | CUTANEOUS | Status: DC | PRN

## 2020-08-01 MED ORDER — METHYLERGONOVINE MALEATE 0.2 MG PO TABS: 0.2000 mg | ORAL_TABLET | ORAL | Status: DC | PRN

## 2020-08-01 MED ORDER — OXYCODONE-ACETAMINOPHEN 5-325 MG PO TABS: 1.0000 | ORAL_TABLET | ORAL | Status: DC | PRN

## 2020-08-01 MED ORDER — OXYCODONE HCL 5 MG PO TABS: 10.0000 mg | ORAL_TABLET | ORAL | Status: DC | PRN

## 2020-08-01 MED ORDER — OXYTOCIN-SODIUM CHLORIDE 30-0.9 UT/500ML-% IV SOLN: 2.5000 [IU]/h | INTRAVENOUS | Status: DC

## 2020-08-01 MED ORDER — MISOPROSTOL 25 MCG QUARTER TABLET
25.0000 ug | ORAL_TABLET | ORAL | Status: DC | PRN
  Administered 2020-08-01 (×2): 25 ug via VAGINAL
  Filled 2020-08-01 (×2): qty 1

## 2020-08-01 MED ORDER — TETANUS-DIPHTH-ACELL PERTUSSIS 5-2.5-18.5 LF-MCG/0.5 IM SUSY: 0.5000 mL | PREFILLED_SYRINGE | Freq: Once | INTRAMUSCULAR | Status: DC

## 2020-08-01 MED ORDER — METOPROLOL TARTRATE 50 MG PO TABS
50.0000 mg | ORAL_TABLET | Freq: Every day | ORAL | Status: DC
  Administered 2020-08-02 – 2020-08-03 (×2): 50 mg via ORAL
  Filled 2020-08-01 (×3): qty 1

## 2020-08-01 MED ORDER — ONDANSETRON HCL 4 MG/2ML IJ SOLN
4.0000 mg | Freq: Four times a day (QID) | INTRAMUSCULAR | Status: DC | PRN
  Administered 2020-08-01 (×2): 4 mg via INTRAVENOUS
  Filled 2020-08-01 (×2): qty 2

## 2020-08-01 MED ORDER — ACETAMINOPHEN 325 MG PO TABS: 650.0000 mg | ORAL_TABLET | ORAL | Status: DC | PRN

## 2020-08-01 MED ORDER — DIBUCAINE (PERIANAL) 1 % EX OINT: 1.0000 "application " | TOPICAL_OINTMENT | CUTANEOUS | Status: DC | PRN

## 2020-08-01 MED ORDER — BENZOCAINE-MENTHOL 20-0.5 % EX AERO: 1.0000 "application " | INHALATION_SPRAY | CUTANEOUS | Status: DC | PRN

## 2020-08-01 MED ORDER — SIMETHICONE 80 MG PO CHEW: 80.0000 mg | CHEWABLE_TABLET | ORAL | Status: DC | PRN

## 2020-08-01 MED ORDER — SOD CITRATE-CITRIC ACID 500-334 MG/5ML PO SOLN: 30.0000 mL | ORAL | Status: DC | PRN

## 2020-08-01 MED ORDER — LACTATED RINGERS IV SOLN: INTRAVENOUS | Status: DC

## 2020-08-01 MED ORDER — DIPHENHYDRAMINE HCL 25 MG PO CAPS: 25.0000 mg | ORAL_CAPSULE | Freq: Four times a day (QID) | ORAL | Status: DC | PRN

## 2020-08-01 MED ORDER — PRENATAL MULTIVITAMIN CH
1.0000 | ORAL_TABLET | Freq: Every day | ORAL | Status: DC
  Administered 2020-08-02: 1 via ORAL
  Filled 2020-08-01: qty 1

## 2020-08-01 NOTE — Anesthesia Preprocedure Evaluation (Signed)
Anesthesia Evaluation  Patient identified by MRN, date of birth, ID band Patient awake    Reviewed: Allergy & Precautions, H&P , NPO status , Patient's Chart, lab work & pertinent test results, reviewed documented beta blocker date and time   History of Anesthesia Complications Negative for: history of anesthetic complications  Airway Mallampati: II  TM Distance: >3 FB Neck ROM: full    Dental no notable dental hx.    Pulmonary neg pulmonary ROS,    Pulmonary exam normal        Cardiovascular hypertension, On Home Beta Blockers and On Medications Normal cardiovascular exam Rhythm:regular Rate:Normal     Neuro/Psych Anxiety Depression negative neurological ROS     GI/Hepatic negative GI ROS, Neg liver ROS,   Endo/Other  Morbid obesity  Renal/GU      Musculoskeletal   Abdominal   Peds  Hematology negative hematology ROS (+)   Anesthesia Other Findings   Reproductive/Obstetrics (+) Pregnancy                             Anesthesia Physical Anesthesia Plan  ASA: III  Anesthesia Plan: Epidural   Post-op Pain Management:    Induction:   PONV Risk Score and Plan:   Airway Management Planned:   Additional Equipment:   Intra-op Plan:   Post-operative Plan:   Informed Consent: I have reviewed the patients History and Physical, chart, labs and discussed the procedure including the risks, benefits and alternatives for the proposed anesthesia with the patient or authorized representative who has indicated his/her understanding and acceptance.       Plan Discussed with:   Anesthesia Plan Comments:         Anesthesia Quick Evaluation

## 2020-08-01 NOTE — H&P (Addendum)
[name] is a 34 y.o. female, G2 P1001, EGA [redacted] weeks with EDC 12-13 presenting for induction for CHTN.  BP controlled during pregnancy with metoprolol.  Had IUGR by [facility] at 32 weeks, reassuring testing, IUGR resolved by f/u u/s.  AMA with low risk materniT21, anxiety/depression controlled with medication.  OB History    Gravida  2   Para  1   Term  1   Preterm      AB      Living  1     SAB      TAB      Ectopic      Multiple      Live Births  1          Past Medical History:  Diagnosis Date  . Anxiety   . Depression   . Hypertension   . Pregnancy induced hypertension    Past Surgical History:  Procedure Laterality Date  . FOOT SURGERY    . HERNIA REPAIR    . NASAL SINUS SURGERY    . WISDOM TOOTH EXTRACTION     Family History: family history includes Breast cancer in her mother; Heart disease in her paternal grandfather; Hypertension in her father and mother. Social History:  reports that she has never smoked. She has never used smokeless tobacco. She reports that she does not drink alcohol and does not use drugs.     Maternal Diabetes: No Genetic Screening: Normal Maternal Ultrasounds/Referrals: Normal Fetal Ultrasounds or other Referrals:  None Maternal Substance Abuse:  No Significant Maternal Medications:  Meds include: Zoloft Other:  Significant Maternal Lab Results:  Group B Strep positive Other Comments:  CHTN, on Toprol  Review of Systems  Respiratory: Negative.   Cardiovascular: Negative.    Maternal Medical History:  Fetal activity: Perceived fetal activity is normal.    Prenatal complications: PIH.   Prenatal Complications - Diabetes: none.   Intracervical foley placed Dilation: 1.5 Effacement (%): 30 Station: -3 Exam by:: Dr. Jackelyn Knife  Blood pressure 129/77, pulse 70, temperature 97.6 F (36.4 C), temperature source Oral, resp. rate 18, height 5\' 4"  (1.626 m), weight 106.1 kg, unknown if currently breastfeeding. Maternal  Exam:  Uterine Assessment: Contraction strength is mild.  Contraction frequency is irregular.   Abdomen: Patient reports no abdominal tenderness. Estimated fetal weight is 7.5 lbs.   Fetal presentation: vertex  Introitus: Normal vulva. Normal vagina.  Amniotic fluid character: not assessed.  Pelvis: adequate for delivery.      Fetal Exam Fetal Monitor Review: Mode: ultrasound.   Baseline rate: 120-130.  Variability: moderate (6-25 bpm).   Pattern: accelerations present and no decelerations.    Fetal State Assessment: Category I - tracings are normal.     Physical Exam Vitals reviewed.  Constitutional:      Appearance: Normal appearance.  Cardiovascular:     Rate and Rhythm: Normal rate and regular rhythm.  Pulmonary:     Effort: Respiratory distress present.  Abdominal:     Palpations: Abdomen is soft.  Genitourinary:    General: Normal vulva.  Neurological:     Mental Status: She is alert.     Prenatal labs: ABO, Rh: --/--/A POS (11/29 0030) Antibody: NEG (11/29 0030) Rubella:  immune RPR:   NR HBsAg:   neg HIV:   NR GBS: Positive/-- (11/19 0000)   Assessment/Plan: IUP at 38 weeks, CHTN for induction, GBS pos.  Cervix was not favorable, admitted last pm and has had 2 doses of cytotec.  Intracervical foley placed this am and will start pitocin.  BP controlled so far with her PO metoprolol, on PCN for pos GBS  Leighton Roach Mikea Quadros 08/01/2020, 9:05 AM

## 2020-08-01 NOTE — Anesthesia Procedure Notes (Signed)
Epidural Patient location during procedure: OB Start time: 08/01/2020 10:18 AM End time: 08/01/2020 10:28 AM  Staffing Anesthesiologist: Lucretia Kern, MD Performed: anesthesiologist   Preanesthetic Checklist Completed: patient identified, IV checked, risks and benefits discussed, monitors and equipment checked, pre-op evaluation and timeout performed  Epidural Patient position: sitting Prep: DuraPrep Patient monitoring: heart rate, continuous pulse ox and blood pressure Approach: midline Location: L3-L4 Injection technique: LOR air  Needle:  Needle type: Tuohy  Needle gauge: 17 G Needle length: 9 cm Needle insertion depth: 6 cm Catheter type: closed end flexible Catheter size: 19 Gauge Catheter at skin depth: 11 cm Test dose: negative  Assessment Events: blood not aspirated, injection not painful, no injection resistance, no paresthesia and negative IV test  Additional Notes Reason for block:procedure for pain

## 2020-08-01 NOTE — Progress Notes (Addendum)
Comfortable with epidural, foley came out 1-2 hrs ago Afeb, VSS FHT- 110s, min-mod variability, + accels, + scalp stim, occasional small variable decel, , ctx q 2-3 min, Cat II VE-4/50/-2, vtx, AROM clear Continue pitocin, monitor progress, continue PCN for +GBS, anticipate SVD

## 2020-08-02 NOTE — Lactation Note (Signed)
This note was copied from a baby's chart. Lactation Consultation Note  Patient Name: Boy Pailyn Bellevue RWERX'V Date: 08/02/2020 Reason for consult: Follow-up assessment;Mother's request;Early term 37-38.6wks;Infant < 6lbs  Infant is 38 weeks and 24 hours old. Mom started formula at 10:30 am concerned she did not have enough. Infant had bouts of emesis according to parents since formula started. Feeding at 10: 30 am was 10 ml of BM and 20 ml of formula. Later on parents gave at 2:10 pm 5 ml of formula and at 2:32 pm 5 ml of EBM/ 79ml of formula.   LC reviewed with parents LPTI guidelines for supplementation based on hours after birth. Mom also offering one breast and alternating with feeds. LC examined Mom's breast with a compression stripe on the left but both nipples are erect and no other signs of trauma noted.   Bouts of emesis occurred after large volumes and slow flow nipple. LC unable to see a latch since infant just completed a feeding. LC provided parents with a purple slow flow nipple and went over how to pace bottle feed. LC reviewed with parents not to mix formula with EBM in same bottle.   Mom pumping almost every 3 hours for 15 minutes collecting 5 ml after 2 pumping sessions.   Parents are aware to keep feeding under 30 minutes.   Plan 1. To feed based on cues 8-12 x in 24 hour period no more than 4 hours without an attempt. Mom to offer both breasts and look for signs of milk transfer.          2. Mom to supplement with EBM or formula based on LPTI supplementation guidelines reviewed with purple nipple.         3. Pump consistently during the day q 3hours for 15 minutes on DEBP.         4. Mom to call for next feeding so LC can observe a latch.          5. All questions answered at the end of the visit.

## 2020-08-02 NOTE — Lactation Note (Signed)
This note was copied from a baby's chart. Lactation Consultation Note Baby 11 hrs old. LC attempted to see mom. Everyone in rm. Sleeping.  Patient Name: [name] [id number] Date: 08/02/2020     Maternal Data    Feeding Feeding Type: Breast Fed  LATCH Score                   Interventions    Lactation Tools Discussed/Used     Consult Status      Charyl Dancer 08/02/2020, 5:16 AM

## 2020-08-02 NOTE — Lactation Note (Signed)
This note was copied from a baby's chart. Lactation Consultation Note Baby 13 hrs old. Mom stated baby has been latching well. Baby opens mouth wide. Mom holding baby STS. Mom stated baby hadn't been long finished BF. Encouraged to call for latch.  Mom has 35 yr old that she BF for 6 months until she returned to work. Mom has DEBP at home.  Discussed pumping here at hospital, mom agreed. Mom shown how to use DEBP & how to disassemble, clean, & reassemble parts. Mom knows to pump q3h for 15-20 min. Encouraged to hand express after pumping to give back colostrum to baby. Mom demonstrated hand expression w/colostrum pouring out. Praised mom.  LPI information sheet given to mom d/t less than 6 lbs. And reviewed. Mom in agreement of supplementing after BF baby. LC feels like mom will have plenty to give to baby for a good while. Mom informed of Donor milk and formula. Mom chooses formula if the baby needs anything thing other than mom's EBM.  Mom has Large pendulous breast w/med. Large everted nipples. Mom stated the baby feeds well. Denies painful latches.  Mom encouraged to feed baby 8-12 times/24 hours and with feeding cues.  Newborn feeding habits, STS, I&O, milk storage, breast massage, supply and demand discussed.  Encouraged to call for assistance or questions. Lactation brochure given.  Patient Name: [name] [id number] Date: 08/02/2020 Reason for consult: Initial assessment;Infant < 6lbs;Early term 37-38.6wks   Maternal Data Has patient been taught Hand Expression?: Yes Does the patient have breastfeeding experience prior to this delivery?: Yes  Feeding Feeding Type: Breast Fed  LATCH Score       Type of Nipple: Everted at rest and after stimulation  Comfort (Breast/Nipple): Soft / non-tender        Interventions Interventions: Breast feeding basics reviewed;DEBP;Skin to skin;Position options;Breast massage;Hand express;Breast compression  Lactation  Tools Discussed/Used Tools: Pump Breast pump type: Double-Electric Breast Pump WIC Program: No Pump Review: Setup, frequency, and cleaning;Milk Storage Initiated by:: Peri Jefferson RN IBCLC Date initiated:: 08/02/20   Consult Status Consult Status: Follow-up Date: 08/03/20 Follow-up type: In-patient    Charyl Dancer 08/02/2020, 6:41 AM

## 2020-08-02 NOTE — Social Work (Signed)
CSW received consult for hx of Anxiety and Depression.  MOB also scored 11 on the Edinburgh, with 1 on question #10. CSW met with MOB to offer support and complete assessment.     CSW introduced self and role. CSW observed MOB finishing pumping and FOB in chair holding newborn. CSW introduced self and role. CSW asked MOB if she would like to speak alone to respect privacy. MOB declined and stated FOB could remain in room. CSW informed MOB of the reason for consult. MOB expressed understanding and immediately reported that she did not mean to put 1 to question #10 on the Edinburgh. MOB stated she answered the questions based on in life and not the past 7 days. MOB laughed and stated she was sleep deprived when answering the questions. CSW expressed understanding. MOB confirmed a diagnosis of depression and anxiety. MOB reported she was first diagnosed in 2006 or 2007, her junior year of college. MOB stated she has been on Zoloft 81m throughout her pregnancy, which is helpful. MOB shared she had a really good pregnancy and did not experience any depressive or anxious symptoms. MOB reported she last attended therapy in college and it helped her identify coping skills. MOB identified her mom, dad and friends as supports. MOB denies any current SI or HI.   CSW provided education regarding the baby blues period vs. perinatal mood disorders, discussed treatment and gave resources for mental health follow up if concerns arise.  CSW recommends self-evaluation during the postpartum time period using the New Mom Checklist from Postpartum Progress and encouraged MOB to contact a medical professional if symptoms are noted at any time.   CSW provided review of Sudden Infant Death Syndrome (SIDS) precautions. MOB reported newborn will sleep in a bassinet once home. MOB identified Dr. AHarle Battiestwith [place] follow-[place] care and denies any transportation barriers. MOB stated they have all of the  essential needs for newborn to discharge home, including a carseat. MOB expressed no additional needs or concerns.   CSW identifies no further need for intervention and no barriers to discharge at this time.  CDarra Lis LBowersWork WEnterprise Productsand CMolson [phone]

## 2020-08-02 NOTE — Progress Notes (Signed)
Post Partum Day 1 Subjective: no complaints, up ad lib and tolerating PO  Working on breastfeeding and states baby doing pretty well with latch  Objective: Blood pressure 131/90, pulse 71, temperature 98.1 F (36.7 C), temperature source Oral, resp. rate 18, height 5\' 4"  (1.626 m), weight 106.1 kg, SpO2 100 %, unknown if currently breastfeeding.  Physical Exam:  General: alert and cooperative Lochia: appropriate Uterine Fundus: firm    Recent Labs    08/01/20 0030 08/01/20 0841  HGB 12.3 11.4*  HCT 38.4 34.8*    Assessment/Plan: Plan for discharge tomorrow D/w pt circumcision and will do later this PM if peds clears given early term and < 6 lbs BP stable on meds  LOS: 1 day   08/03/20 08/02/2020, 9:29 AM

## 2020-08-02 NOTE — Anesthesia Postprocedure Evaluation (Signed)
Anesthesia Post Note  Patient: [name]  Procedure(s) Performed: AN AD HOC LABOR EPIDURAL     Patient location during evaluation: Mother Baby Anesthesia Type: Epidural Level of consciousness: awake and alert Pain management: pain level controlled Vital Signs Assessment: post-procedure vital signs reviewed and stable Respiratory status: spontaneous breathing, nonlabored ventilation and respiratory function stable Cardiovascular status: stable Postop Assessment: no headache, no backache and epidural receding Anesthetic complications: no   No complications documented.  Last Vitals:  Vitals:   08/01/20 1901 08/01/20 2020  BP: 119/72 131/90  Pulse: 80 71  Resp: 16 18  Temp:  36.7 C  SpO2:  100%    Last Pain:  Vitals:   08/01/20 2134  TempSrc:   PainSc: 2    Pain Goal: Patients Stated Pain Goal: 5 (08/01/20 1012)                 Davius Goudeau

## 2020-08-03 MED ORDER — IBUPROFEN 600 MG PO TABS: 600.0000 mg | ORAL_TABLET | Freq: Four times a day (QID) | ORAL | 0 refills | Status: AC

## 2020-08-03 NOTE — Lactation Note (Signed)
This note was copied from a baby's chart. Lactation Consultation Note  Patient Name: [name] [id number] Date: 08/03/2020 Reason for consult: Follow-up assessment;Infant < 6lbs   P2, Baby 42 hours old. < 6 lbs.  Mother denies questions or concerns. Discussed waking 5 lb baby is needed for feedings and pumping and giving volume back if baby becomes sleepy at the breast. Feed on demand with cues.  Goal 8-12+ times per day after first 24 hrs.  Place baby STS if not cueing.  Reviewed engorgement care and monitoring voids/stools.    Maternal Data    Feeding Feeding Type: Breast Fed  LATCH Score                   Interventions Interventions: Breast feeding basics reviewed  Lactation Tools Discussed/Used     Consult Status Consult Status: Complete Date: 08/03/20    Dahlia Byes [facility] 08/03/2020, 11:43 AM

## 2020-08-03 NOTE — Discharge Instructions (Signed)
As per discharge pamphlet °

## 2020-08-03 NOTE — Discharge Summary (Signed)
Postpartum Discharge Summary      Patient Name: [name] DOB: [birth year] MRN: [id number]  Date of admission: 08/01/2020 Delivery date:08/01/2020  Delivering provider: Jackelyn Knife, Paul Trettin  Date of discharge: 08/03/2020  Admitting diagnosis: Pre-existing hypertension affecting pregnancy in third trimester [O10.913] Intrauterine pregnancy: [redacted]w[redacted]d     Secondary diagnosis:  Active Problems:   Pre-existing hypertension affecting pregnancy in third trimester     Discharge diagnosis: Term Pregnancy Delivered and [facility] course: Induction of Labor With Vaginal Delivery   34 y.o. yo G2P2002 at [redacted]w[redacted]d was admitted to the hospital 08/01/2020 for induction of labor.  Indication for induction: CHTN.  Patient had an uncomplicated labor course as follows: Membrane Rupture Time/Date: 12:54 PM ,08/01/2020   Delivery Method:Vaginal, Spontaneous  Episiotomy: None  Lacerations:  1st degree  Details of delivery can be found in separate delivery note.  Patient had a routine postpartum course. Patient is discharged home 08/03/20.  Newborn Data: Birth date:08/01/2020  Birth time:5:19 PM  Gender:Female  Living status:Living  Apgars:9 ,9  Weight:2546 g    Physical exam  Vitals:   08/02/20 0929 08/02/20 1408 08/02/20 2114 08/03/20 0542  BP: 118/75 114/68 130/90 119/80  Pulse: 68 65 (!) 58 (!) 54  Resp: 19 17 18 18   Temp: 98 F (36.7 C) 98.3 F (36.8 C) 98.5 F (36.9 C) 97.8 F (36.6 C)  TempSrc: Axillary Oral Oral Oral  SpO2: 99% 100%    Weight:      Height:       General: alert Lochia: appropriate Uterine Fundus: firm  Labs: Lab Results  Component Value Date   WBC 13.7 (H) 08/01/2020   HGB 11.4 (L) 08/01/2020   HCT 34.8 (L) 08/01/2020   MCV 85.3 08/01/2020   PLT 218 08/01/2020   CMP Latest Ref Rng & Units 06/18/2013  Glucose 70 - 99 mg/dL 97  BUN 6 - 23 mg/dL 11  Creatinine 06/20/2013 - 0.96 mg/dL 2.83  Sodium 6.62 - 947 mEq/L  135  Potassium 3.5 - 5.1 mEq/L 3.6  Chloride 96 - 112 mEq/L 101  CO2 19 - 32 mEq/L 22  Calcium 8.4 - 10.5 mg/dL 9.7  Total Protein 6.0 - 8.3 g/dL 6.7  Total Bilirubin 0.3 - 1.2 mg/dL 0.3  Alkaline Phos 39 - 117 U/L 170(H)  AST 0 - 37 U/L 16  ALT 0 - 35 U/L 11   Edinburgh Score: Edinburgh Postnatal Depression Scale Screening Tool 08/01/2020  I have been able to laugh and see the funny side of things. 0  I have looked forward with enjoyment to things. 0  I have blamed myself unnecessarily when things went wrong. 2  I have been anxious or worried for no good reason. 2  I have felt scared or panicky for no good reason. 2  Things have been getting on top of me. 2  I have been so unhappy that I have had difficulty sleeping. 0  I have felt sad or miserable. 1  I have been so unhappy that I have been crying. 1  The thought of harming myself has occurred to me. 1  Edinburgh Postnatal Depression Scale Total 11      After visit meds:  Allergies as of  08/03/2020   No Known Allergies     Medication List    STOP taking these medications   cephALEXin 500 MG capsule Commonly known as: KEFLEX   EFFEXOR XR PO   labetalol 100 MG tablet Commonly known as: NORMODYNE     TAKE these medications   ibuprofen 600 MG tablet Commonly known as: ADVIL Take 1 tablet (600 mg total) by mouth every 6 (six) hours as needed. What changed: Another medication with the same name was added. Make sure you understand how and when to take each.   ibuprofen 600 MG tablet Commonly known as: ADVIL Take 1 tablet (600 mg total) by mouth every 6 (six) hours. What changed: You were already taking a medication with the same name, and this prescription was added. Make sure you understand how and when to take each.   METOPROLOL TARTRATE PO Take by mouth.   prenatal multivitamin Tabs tablet Take 1 tablet by mouth daily at 12 noon.   sertraline 50 MG tablet Commonly known as: ZOLOFT Take 75 mg by mouth  daily.        Discharge home in stable condition Infant Feeding: Breast Infant Disposition:home with mother Discharge instruction: per After Visit Summary and Postpartum booklet. Activity: Advance as tolerated. Pelvic rest for 6 weeks.  Diet: routine diet  Postpartum Appointment:6 weeks Follow up Visit:  Follow-up Information    Fawnda Vitullo, MD. Schedule an appointment as soon as possible for a visit in 6 week(s).   Specialty: Obstetrics and Gynecology Contact information: [address] [place] [address] [phone]                   08/03/2020 Zenaida Niece, MD

## 2020-08-03 NOTE — Progress Notes (Signed)
PPD #2 Doing well Afeb, VSS, BP normal on medication D/c home today

## 2022-09-01 ENCOUNTER — Encounter (HOSPITAL_BASED_OUTPATIENT_CLINIC_OR_DEPARTMENT_OTHER): Payer: Self-pay | Admitting: Urology

## 2022-09-01 ENCOUNTER — Emergency Department (HOSPITAL_BASED_OUTPATIENT_CLINIC_OR_DEPARTMENT_OTHER)
Admission: EM | Admit: 2022-09-01 | Discharge: 2022-09-01 | Disposition: A | Discharge status: Home / Self Care | Payer: BC Managed Care – PPO | Attending: Emergency Medicine | Admitting: Emergency Medicine

## 2022-09-01 DIAGNOSIS — H60391 Other infective otitis externa, right ear: Secondary | ICD-10-CM | POA: Insufficient documentation

## 2022-09-01 DIAGNOSIS — H9201 Otalgia, right ear: Secondary | ICD-10-CM | POA: Diagnosis present

## 2022-09-01 MED ORDER — AMOXICILLIN-POT CLAVULANATE 875-125 MG PO TABS: 1.0000 | ORAL_TABLET | Freq: Two times a day (BID) | ORAL | 0 refills | Status: AC

## 2022-09-01 MED ORDER — AMOXICILLIN-POT CLAVULANATE 875-125 MG PO TABS
1.0000 | ORAL_TABLET | Freq: Once | ORAL | Status: AC
  Administered 2022-09-01: 1 via ORAL
  Filled 2022-09-01: qty 1

## 2022-09-01 NOTE — ED Notes (Signed)
Rx x 1 given  Written and verbal inst to pt  Verbalized an understanding  To home  

## 2022-09-01 NOTE — ED Triage Notes (Signed)
Pt states right ear clogged this am and states pain and pressure has gotten worse  Had tylenol 2 hrs PTA

## 2022-09-01 NOTE — ED Provider Notes (Signed)
[facility] EMERGENCY DEPARTMENT  Provider Note  CSN: [id number] Arrival date & time: 09/01/22 0154  History Chief Complaint  Patient presents with   Otalgia    [name] is a 36 y.o. female who reports she had influenza a couple of weeks ago and has had some ear discomfort since then with pressure moving from L ear to the R. Today she has had increasing pain to the R ear with muffled hearing/buzzing and drainage of a purulent material. No fevers. No trauma to the ear.    Home Medications Prior to Admission medications   Medication Sig Start Date End Date Taking? Authorizing Provider  amoxicillin-clavulanate (AUGMENTIN) 875-125 MG tablet Take 1 tablet by mouth every 12 (twelve) hours. 09/01/22  Yes Pollyann Savoy, MD  ibuprofen (ADVIL) 600 MG tablet Take 1 tablet (600 mg total) by mouth every 6 (six) hours. 08/03/20   Meisinger, Tawanna Cooler, MD  ibuprofen (ADVIL,MOTRIN) 600 MG tablet Take 1 tablet (600 mg total) by mouth every 6 (six) hours as needed. 07/22/18   Glynn Octave, MD  METOPROLOL TARTRATE PO Take by mouth.    [provider]  Prenatal Vit-Fe Fumarate-FA (PRENATAL MULTIVITAMIN) TABS tablet Take 1 tablet by mouth daily at 12 noon.    [provider]  sertraline (ZOLOFT) 50 MG tablet Take 75 mg by mouth daily.    [provider]     Allergies    Patient has no known allergies.   Review of Systems   Review of Systems Please see HPI for pertinent positives and negatives  Physical Exam BP (!) 135/90 (BP Location: Left Arm)   Pulse 71   Temp 98 F (36.7 C) (Oral)   Resp 16   Ht 5\' 4"  (1.626 m)   Wt 106.1 kg   SpO2 95%   BMI 40.15 kg/m   Physical Exam Vitals and nursing note reviewed.  HENT:     Head: Normocephalic.     Ears:     Comments: L canal and TM are normal. R canal is erythematous and indurated but not completely occluded, there is purulent drainage in the canal obscuring the TM. Right pinna is  erythematous    Nose: Nose normal.  Eyes:     Extraocular Movements: Extraocular movements intact.  Pulmonary:     Effort: Pulmonary effort is normal.  Musculoskeletal:        General: Normal range of motion.     Cervical back: Neck supple.  Skin:    Findings: No rash (on exposed skin).  Neurological:     Mental Status: She is alert and oriented to person, place, and time.  Psychiatric:        Mood and Affect: Mood normal.     ED Results / Procedures / Treatments   EKG None  Procedures Procedures  Medications Ordered in the ED Medications  amoxicillin-clavulanate (AUGMENTIN) 875-125 MG per tablet 1 tablet (has no administration in time range)    Initial Impression and Plan  Patient here with R ear pain and signs of significant otitis externa with obscured TM. Will treat with oral Abx. Recommend close outpatient ENT follow up to eval TM rupture. RTED for any other concerns.   ED Course       MDM Rules/Calculators/A&P Medical Decision Making Problems Addressed: Other infective acute otitis externa of right ear: acute illness or injury  Risk Prescription drug management.    Final Clinical Impression(s) / ED Diagnoses Final diagnoses:  Other infective acute otitis  externa of right ear    Rx / DC Orders ED Discharge Orders          Ordered    amoxicillin-clavulanate (AUGMENTIN) 875-125 MG tablet  Every 12 hours        09/01/22 0341             Pollyann Savoy, MD 09/01/22 [phone]

## 2022-09-07 ENCOUNTER — Other Ambulatory Visit (HOSPITAL_COMMUNITY): Payer: Self-pay

## 2022-09-07 MED ORDER — WEGOVY 0.5 MG/0.5ML ~~LOC~~ SOAJ: 0.5000 mg | SUBCUTANEOUS | 0 refills | Status: AC

## 2022-09-07 MED ORDER — WEGOVY 0.25 MG/0.5ML ~~LOC~~ SOAJ
0.2500 mg | SUBCUTANEOUS | 0 refills | Status: AC
  Filled 2022-09-07 – 2022-09-19 (×3): qty 2, 28d supply, fill #0

## 2022-09-07 MED ORDER — WEGOVY 1 MG/0.5ML ~~LOC~~ SOAJ: 1.0000 mg | SUBCUTANEOUS | 0 refills | Status: AC

## 2022-09-10 ENCOUNTER — Other Ambulatory Visit (HOSPITAL_COMMUNITY): Payer: Self-pay

## 2022-09-11 ENCOUNTER — Other Ambulatory Visit (HOSPITAL_COMMUNITY): Payer: Self-pay

## 2022-09-15 ENCOUNTER — Other Ambulatory Visit (HOSPITAL_COMMUNITY): Payer: Self-pay

## 2022-09-17 ENCOUNTER — Other Ambulatory Visit (HOSPITAL_COMMUNITY): Payer: Self-pay

## 2022-09-19 ENCOUNTER — Other Ambulatory Visit (HOSPITAL_COMMUNITY): Payer: Self-pay

## 2022-09-19 ENCOUNTER — Other Ambulatory Visit: Payer: Self-pay

## 2022-10-09 ENCOUNTER — Other Ambulatory Visit: Payer: Self-pay
